# Patient Record
Sex: Female | Born: 1990 | Race: Black or African American | Hispanic: No | Marital: Single | Smoking: Current every day smoker
Health system: Southern US, Community
[De-identification: ages and names within clinical notes are randomized; demographics above are authoritative.]

## PROBLEM LIST (undated history)

## (undated) ENCOUNTER — Inpatient Hospital Stay (HOSPITAL_COMMUNITY): Payer: Self-pay

## (undated) DIAGNOSIS — G43909 Migraine, unspecified, not intractable, without status migrainosus: Secondary | ICD-10-CM

## (undated) DIAGNOSIS — J45909 Unspecified asthma, uncomplicated: Secondary | ICD-10-CM

## (undated) HISTORY — PX: NO PAST SURGERIES: SHX2092

---

## 2013-09-21 ENCOUNTER — Emergency Department (HOSPITAL_COMMUNITY)
Admission: EM | Admit: 2013-09-21 | Discharge: 2013-09-21 | Disposition: A | Discharge status: Home / Self Care | Payer: Medicaid - Out of State | Attending: Emergency Medicine | Admitting: Emergency Medicine

## 2013-09-21 ENCOUNTER — Encounter (HOSPITAL_COMMUNITY): Payer: Self-pay | Admitting: Emergency Medicine

## 2013-09-21 DIAGNOSIS — Z79899 Other long term (current) drug therapy: Secondary | ICD-10-CM | POA: Insufficient documentation

## 2013-09-21 DIAGNOSIS — F17200 Nicotine dependence, unspecified, uncomplicated: Secondary | ICD-10-CM | POA: Insufficient documentation

## 2013-09-21 DIAGNOSIS — N12 Tubulo-interstitial nephritis, not specified as acute or chronic: Secondary | ICD-10-CM | POA: Insufficient documentation

## 2013-09-21 DIAGNOSIS — J45909 Unspecified asthma, uncomplicated: Secondary | ICD-10-CM | POA: Insufficient documentation

## 2013-09-21 DIAGNOSIS — Z3202 Encounter for pregnancy test, result negative: Secondary | ICD-10-CM | POA: Insufficient documentation

## 2013-09-21 HISTORY — DX: Migraine, unspecified, not intractable, without status migrainosus: G43.909

## 2013-09-21 HISTORY — DX: Unspecified asthma, uncomplicated: J45.909

## 2013-09-21 LAB — CBC WITH DIFFERENTIAL/PLATELET
Basophils Absolute: 0 10*3/uL (ref 0.0–0.1)
Basophils Relative: 0 % (ref 0–1)
Eosinophils Absolute: 0.2 10*3/uL (ref 0.0–0.7)
HCT: 40.8 % (ref 36.0–46.0)
Hemoglobin: 13.7 g/dL (ref 12.0–15.0)
Lymphocytes Relative: 13 % (ref 12–46)
MCH: 29.7 pg (ref 26.0–34.0)
Monocytes Absolute: 0.7 10*3/uL (ref 0.1–1.0)
Neutro Abs: 7.8 10*3/uL — ABNORMAL HIGH (ref 1.7–7.7)
Neutrophils Relative %: 79 % — ABNORMAL HIGH (ref 43–77)
Platelets: 246 10*3/uL (ref 150–400)
RDW: 12.6 % (ref 11.5–15.5)
WBC: 9.9 10*3/uL (ref 4.0–10.5)

## 2013-09-21 LAB — URINE MICROSCOPIC-ADD ON

## 2013-09-21 LAB — COMPREHENSIVE METABOLIC PANEL
Alkaline Phosphatase: 83 U/L (ref 39–117)
BUN: 8 mg/dL (ref 6–23)
CO2: 28 mEq/L (ref 19–32)
Calcium: 9.4 mg/dL (ref 8.4–10.5)
GFR calc Af Amer: >90 mL/min (ref >90)
GFR calc non Af Amer: >90 mL/min (ref >90)
Glucose, Bld: 77 mg/dL (ref 70–99)
Sodium: 138 mEq/L (ref 135–145)
Total Protein: 8 g/dL (ref 6.0–8.3)

## 2013-09-21 LAB — URINALYSIS, ROUTINE W REFLEX MICROSCOPIC
Ketones, ur: 15 mg/dL — AB
Nitrite: NEGATIVE
Protein, ur: 100 mg/dL — AB
Specific Gravity, Urine: 1.026 (ref 1.005–1.030)
Urobilinogen, UA: 0.2 mg/dL (ref 0.0–1.0)
pH: 5.5 (ref 5.0–8.0)

## 2013-09-21 MED ORDER — CIPROFLOXACIN HCL 500 MG PO TABS: 500.0000 mg | ORAL_TABLET | Freq: Two times a day (BID) | ORAL | Status: DC

## 2013-09-21 NOTE — ED Provider Notes (Signed)
CSN: [id number]     Arrival date & time 09/21/13  1543 History   First MD Initiated Contact with Patient 09/21/13 1607     Chief Complaint  Patient presents with  . Flank Pain   (Consider location/radiation/quality/duration/timing/severity/associated sxs/prior Treatment) Patient is a 22 y.o. female presenting with flank pain.  Flank Pain This is a new problem. The current episode started yesterday. The problem occurs constantly. The problem has been unchanged. Pertinent negatives include no abdominal pain, chest pain, coughing, fever, headaches or nausea. Nothing aggravates the symptoms. She has tried nothing for the symptoms.   Chief complaint: Flank pain history route of patient  Patient is a 22 year old female no significant past medical history comes in today with chief complaint of flank pain. Patient states she had symptoms of cystitis approximately one week ago. Including dysuria and frequency. He self resolved. However yesterday patient began having bilateral flank pain. States this is a sharp stabbing pain moderate in severity. Located in bilateral flanks no radiation. It is been present since yesterday. Slightly worsening. No associated fevers chills et Karie Soda. OTC treatment attempted so far mild reduction pain. Past Medical History  Diagnosis Date  . Migraine   . Asthma    History reviewed. No pertinent past surgical history. No family history on file. History  Substance Use Topics  . Smoking status: Current Every Day Smoker -- 0.50 packs/day    Types: Cigarettes  . Smokeless tobacco: Not on file  . Alcohol Use: No   OB History   Grav Para Term Preterm Abortions TAB SAB Ect Mult Living                 Review of Systems  Constitutional: Negative for fever.  Respiratory: Negative for cough and shortness of breath.   Cardiovascular: Negative for chest pain.  Gastrointestinal: Negative for nausea and abdominal pain.  Genitourinary: Positive for flank pain. Negative for  dysuria.  Neurological: Negative for dizziness and headaches.  All other systems reviewed and are negative.    Allergies  Review of patient's allergies indicates no known allergies.  Home Medications   Current Outpatient Rx  Name  Route  Sig  Dispense  Refill  . albuterol (PROVENTIL HFA;VENTOLIN HFA) 108 (90 BASE) MCG/ACT inhaler   Inhalation   Inhale 2 puffs into the lungs every 6 (six) hours as needed for wheezing or shortness of breath.         Marland Kitchen aspirin 325 MG EC tablet   Oral   Take 650 mg by mouth 2 (two) times daily as needed for pain.         Marland Kitchen docusate sodium (COLACE) 100 MG capsule   Oral   Take 200 mg by mouth daily.         . hydrocortisone cream 1 %   Topical   Apply 1 application topically 2 (two) times daily as needed for itching (eczema and facial spots).         . Pediatric Multiple Vit-C-FA (MULTIVITAMIN ANIMAL SHAPES, WITH CA/FA,) WITH C & FA CHEW chewable tablet   Oral   Chew 1 tablet by mouth daily.         . ciprofloxacin (CIPRO) 500 MG tablet   Oral   Take 1 tablet (500 mg total) by mouth every 12 (twelve) hours. For 7 days   14 tablet   0    BP 99/63  Pulse 72  Temp(Src) 98.3 F (36.8 C) (Oral)  Resp 16  Ht 5\' 5"  (1.651  m)  Wt 160 lb (72.576 kg)  BMI 26.63 kg/m2  SpO2 99%  LMP 09/20/2013 Physical Exam  Nursing note and vitals reviewed. Constitutional: She is oriented to person, place, and time. She appears well-developed and well-nourished.  HENT:  Head: Normocephalic and atraumatic.  Eyes: EOM are normal. Pupils are equal, round, and reactive to light.  Neck: Normal range of motion.  Cardiovascular: Normal rate, regular rhythm and intact distal pulses.   No murmur heard. Pulmonary/Chest: Effort normal and breath sounds normal. No respiratory distress.  Abdominal: Soft. She exhibits no distension. There is no tenderness. There is no rebound and no guarding.  Musculoskeletal: Normal range of motion.  Mild bilateral flank  pain. Some CVA tenderness palpation  Neurological: She is alert and oriented to person, place, and time. No cranial nerve deficit. She exhibits normal muscle tone. Coordination normal.  Skin: Skin is warm and dry.  Psychiatric: She has a normal mood and affect. Her behavior is normal. Judgment and thought content normal.    ED Course  Procedures (including critical care time) Labs Review Labs Reviewed  URINALYSIS, ROUTINE W REFLEX MICROSCOPIC - Abnormal; Notable for the following:    Color, Urine RED (*)    APPearance TURBID (*)    Hgb urine dipstick LARGE (*)    Bilirubin Urine MODERATE (*)    Ketones, ur 15 (*)    Protein, ur 100 (*)    Leukocytes, UA LARGE (*)    All other components within normal limits  URINE MICROSCOPIC-ADD ON - Abnormal; Notable for the following:    Squamous Epithelial / LPF FEW (*)    All other components within normal limits  CBC WITH DIFFERENTIAL - Abnormal; Notable for the following:    Neutrophils Relative % 79 (*)    Neutro Abs 7.8 (*)    All other components within normal limits  URINE CULTURE  PREGNANCY, URINE  COMPREHENSIVE METABOLIC PANEL   Imaging Review No results found.  EKG Interpretation   None       MDM   1. Pyelonephritis     22 year old female signs and symptoms consistent with pyelonephritis. Patient with bilateral flank pain. UA showed large leukocytes, too numerous to count bacteria. UA also significant blood. Patient on period currently. Urine culture pending. Patient is nontoxic appearing, in no apparent distress. CBC within normal limits with no leukocytosis. Patient able to tolerate by mouth without issue. CMP unremarkable. No renal dysfunction noted. Feel patient appropriate for outpatient treatment given overall clinical appearance, tolerating by mouth et Karie Soda. Patient will be given Cipro for 7 days. Was given strict return precautions including worsening of pain inability to tolerate by mouth or other concerning signs  or symptoms. Patient denies any vaginal discharge or other pelvic complaints. Clelia Croft was deferred at this time. Remains stable until time discharged no further acute issues.  Patient discussed with attending Dr. Anitra Lauth.      Bridgett Larsson, MD 09/21/13 1950

## 2013-09-21 NOTE — ED Notes (Signed)
Pt is here for bilateral "side pain"  Pt initially thought she was constipated but symptoms unrelieved once constipation resolved.  Pt denies any GYn symptoms.  Pt states that she had some difficulty voiding when this began but pt denies any GU symptoms at this time.  Pt states that she had some nausea and vomiting yesterday and has loose stools (due to laxatives and prune juice for constipation)  Pt states that the pain is 6/10 and leaning forward increases pain

## 2013-09-21 NOTE — ED Provider Notes (Signed)
I saw and evaluated the patient, reviewed the resident's note and I agree with the findings and plan.  EKG Interpretation   None       Pt with evidence of pyelo without complicating features.  No hx of KS and no hx today to suggest stone.  Will treat with abx.  Gwyneth Sprout, MD 09/21/13 2252

## 2013-09-23 LAB — URINE CULTURE: Colony Count: >=100000

## 2013-09-24 ENCOUNTER — Telehealth (HOSPITAL_COMMUNITY): Payer: Self-pay | Admitting: Emergency Medicine

## 2013-09-24 NOTE — ED Notes (Signed)
Post ED Visit - Positive Culture Follow-up: Successful Patient Follow-Up  Culture assessed and recommendations reviewed by: []  Wes Dulaney, Pharm.D., BCPS []  Celedonio Miyamoto, Pharm.D., BCPS []  Georgina Pillion, Pharm.D., BCPS []  [place], [place].D., BCPS, AAHIVP [x]  Estella Husk, Pharm.D., BCPS, AAHIVP  Positive urine culture  []  Patient discharged without antimicrobial prescription and treatment is now indicated [x]  Organism is resistant to prescribed ED discharge antimicrobial []  Patient with positive blood cultures  Changes discussed with ED provider: Marlon Pel PA-C New antibiotic prescription: Septra DS 1 tab PO BID x 7 days    Kylie A Holland 09/24/2013, 12:50 PM

## 2013-09-24 NOTE — Progress Notes (Signed)
ED Antimicrobial Stewardship Positive Culture Follow Up   Sylvia Mccann is an 22 y.o. female who presented to [facility] on 09/21/2013 with a chief complaint of  Chief Complaint  Patient presents with  . Flank Pain    Recent Results (from the past 720 hour(s))  URINE CULTURE     Status: None   Collection Time    09/21/13  4:01 PM      Result Value Range Status   Specimen Description URINE, RANDOM   Final   Special Requests NONE   Final   Culture  Setup Time     Final   Value: 09/21/2013 17:02     Performed at [facility] Count     Final   Value: >=100,000 COLONIES/ML     Performed at [facility]   Culture     Final   Value: STAPHYLOCOCCUS SPECIES (COAGULASE NEGATIVE)     Note: RIFAMPIN AND GENTAMICIN SHOULD NOT BE USED AS SINGLE DRUGS FOR TREATMENT OF STAPH INFECTIONS.     Performed at [facility]   Report Status 09/23/2013 FINAL   Final   Organism ID, Bacteria STAPHYLOCOCCUS SPECIES (COAGULASE NEGATIVE)   Final    [x]  Treated with Cipro, organism resistant to prescribed antimicrobial  New antibiotic prescription: Septra DS 1 tab PO BID x 7 days.  Patient should stop Cipro.  ED Provider: Marlon Pel, PA-C   Sallee Provencal 09/24/2013, 11:16 AM Infectious Diseases Pharmacist Phone# [phone]

## 2013-09-25 ENCOUNTER — Telehealth (HOSPITAL_BASED_OUTPATIENT_CLINIC_OR_DEPARTMENT_OTHER): Payer: Self-pay

## 2013-09-25 NOTE — Telephone Encounter (Signed)
Pt returned call. Verified ID. Informed of lab results. Advised to stop taking cipro and called in Septra DS as prescribed by Tiffany Greene-(take one tab PO BID x 7 days, disp QS no refills) to [facility] on [address] [phone] per pts request. Pt verbalized understanding on how to take medication.

## 2014-05-12 ENCOUNTER — Emergency Department (HOSPITAL_COMMUNITY)
Admission: EM | Admit: 2014-05-12 | Discharge: 2014-05-12 | Disposition: A | Discharge status: Home / Self Care | Payer: No Typology Code available for payment source | Attending: Emergency Medicine | Admitting: Emergency Medicine

## 2014-05-12 ENCOUNTER — Encounter (HOSPITAL_COMMUNITY): Payer: Self-pay | Admitting: Emergency Medicine

## 2014-05-12 DIAGNOSIS — R102 Pelvic and perineal pain: Secondary | ICD-10-CM

## 2014-05-12 DIAGNOSIS — Z3202 Encounter for pregnancy test, result negative: Secondary | ICD-10-CM | POA: Insufficient documentation

## 2014-05-12 DIAGNOSIS — Z79899 Other long term (current) drug therapy: Secondary | ICD-10-CM | POA: Insufficient documentation

## 2014-05-12 DIAGNOSIS — J45909 Unspecified asthma, uncomplicated: Secondary | ICD-10-CM | POA: Insufficient documentation

## 2014-05-12 DIAGNOSIS — F17200 Nicotine dependence, unspecified, uncomplicated: Secondary | ICD-10-CM | POA: Insufficient documentation

## 2014-05-12 DIAGNOSIS — N949 Unspecified condition associated with female genital organs and menstrual cycle: Secondary | ICD-10-CM | POA: Insufficient documentation

## 2014-05-12 DIAGNOSIS — Z8679 Personal history of other diseases of the circulatory system: Secondary | ICD-10-CM | POA: Insufficient documentation

## 2014-05-12 LAB — URINALYSIS, ROUTINE W REFLEX MICROSCOPIC
Bilirubin Urine: NEGATIVE
Glucose, UA: NEGATIVE mg/dL
Hgb urine dipstick: NEGATIVE
Ketones, ur: 15 mg/dL — AB
Leukocytes, UA: NEGATIVE
Nitrite: NEGATIVE
Protein, ur: NEGATIVE mg/dL
Specific Gravity, Urine: 1.025 (ref 1.005–1.030)
Urobilinogen, UA: 1 mg/dL (ref 0.0–1.0)
pH: 6 (ref 5.0–8.0)

## 2014-05-12 LAB — POC URINE PREG, ED: Preg Test, Ur: NEGATIVE

## 2014-05-12 MED ORDER — IBUPROFEN 400 MG PO TABS: 600.0000 mg | ORAL_TABLET | Freq: Once | ORAL | Status: DC

## 2014-05-12 MED ORDER — OXYCODONE-ACETAMINOPHEN 5-325 MG PO TABS
2.0000 | ORAL_TABLET | ORAL | Status: DC | PRN
Start: 2014-05-12 — End: 2014-11-28

## 2014-05-12 MED ORDER — OXYCODONE-ACETAMINOPHEN 5-325 MG PO TABS: 2.0000 | ORAL_TABLET | Freq: Once | ORAL | Status: DC

## 2014-05-12 NOTE — ED Notes (Signed)
Pt presents with bilateral suprapubic pain x1 week, pt also reports frequent urination x2 days, pt states she was diagnosed with a kidney infection November 2014 and has several episodes of a "shocking" sensation to her bilateral flank region. Pt denies burning with urination, abnormal vaginal odor, discharge, or bleeding. Pt does report a hx of ovarian cyst, states the Left one "ruptured" on it's own and she knows she has a small one on the Right side. Pt states she has increase pressure to her abd if she sits up and leans forward.

## 2014-05-12 NOTE — ED Notes (Signed)
She states "im having pelvic pain. It feels like when i had a UTI."

## 2014-05-12 NOTE — ED Notes (Signed)
Pt getting up to leave after seeing Dr. Juleen ChinaKohut, she refused pelvic exam stating that she would be seeing her gynecologist soon. Pt refusing medications, stating she "just wants to leave." Pt given discharge paperwork on her way out, refusing discharge vitals, and e-signature.

## 2014-05-12 NOTE — Discharge Instructions (Signed)

## 2014-05-17 NOTE — ED Provider Notes (Signed)
CSN: [id number]     Arrival date & time 05/12/14  1850 History   First MD Initiated Contact with Patient 05/12/14 2204     Chief Complaint  Patient presents with  . Pelvic Pain     (Consider location/radiation/quality/duration/timing/severity/associated sxs/prior Treatment) HPI  23 year old female with lower abdominal pain. Gradual onset about a week ago. Pain is crampy. Worse when she leans forward. She occasionally has sharper "shocklike" pain which radiates into both of her flanks. Increased urinary frequency the past 2 days. No dysuria. No fevers or chills. No diarrhea. No nausea or vomiting. No unusual vaginal bleeding or discharge. Does not think she is pregnant.  Past Medical History  Diagnosis Date  . Migraine   . Asthma    History reviewed. No pertinent past surgical history. History reviewed. No pertinent family history. History  Substance Use Topics  . Smoking status: Current Every Day Smoker -- 0.50 packs/day    Types: Cigarettes  . Smokeless tobacco: Not on file  . Alcohol Use: No   OB History   Grav Para Term Preterm Abortions TAB SAB Ect Mult Living                 Review of Systems  All systems reviewed and negative, other than as noted in HPI.   Allergies  Review of patient's allergies indicates no known allergies.  Home Medications   Prior to Admission medications   Medication Sig Start Date End Date Taking? Authorizing Provider  albuterol (PROVENTIL HFA;VENTOLIN HFA) 108 (90 BASE) MCG/ACT inhaler Inhale 2 puffs into the lungs every 6 (six) hours as needed for wheezing or shortness of breath.   Yes Historical Provider, MD  divalproex (DEPAKOTE) 250 MG DR tablet Take 250 mg by mouth daily. Take 1 daily x 14 days, 1 every other day x 14 days, then one every third day x 14 days 05/01/14  Yes Historical Provider, MD  Pediatric Multiple Vit-C-FA (MULTIVITAMIN ANIMAL SHAPES, WITH CA/FA,) WITH C & FA CHEW chewable tablet Chew 1 tablet by mouth daily.   Yes  Historical Provider, MD  oxyCODONE-acetaminophen (PERCOCET/ROXICET) 5-325 MG per tablet Take 2 tablets by mouth every 4 (four) hours as needed for severe pain. 05/12/14   Raeford Razor, MD   BP 118/73  Pulse 96  Temp(Src) 98.8 F (37.1 C) (Oral)  Resp 20  Ht 5\' 4"  (1.626 m)  Wt 165 lb (74.844 kg)  BMI 28.31 kg/m2  SpO2 97%  LMP 04/28/2014 Physical Exam  Nursing note and vitals reviewed. Constitutional: She appears well-developed and well-nourished. No distress.  HENT:  Head: Normocephalic and atraumatic.  Eyes: Conjunctivae are normal. Right eye exhibits no discharge. Left eye exhibits no discharge.  Neck: Neck supple.  Cardiovascular: Normal rate, regular rhythm and normal heart sounds.  Exam reveals no gallop and no friction rub.   No murmur heard. Pulmonary/Chest: Effort normal and breath sounds normal. No respiratory distress.  Abdominal: Soft. She exhibits no distension. There is tenderness.  Mild suprapubic tenderness without rebound or guarding  Genitourinary:  No CVA tenderness  Musculoskeletal: She exhibits no edema and no tenderness.  Neurological: She is alert.  Skin: Skin is warm and dry.  Psychiatric: She has a normal mood and affect. Her behavior is normal. Thought content normal.    ED Course  Procedures (including critical care time) Labs Review Labs Reviewed  URINALYSIS, ROUTINE W REFLEX MICROSCOPIC - Abnormal; Notable for the following:    Ketones, ur 15 (*)    All other components  within normal limits  POC URINE PREG, ED    Imaging Review No results found.   EKG Interpretation None      MDM   Final diagnoses:  Pelvic pain in female    23 year old female with pelvic pain. She does have some mild suprapubic tenderness. Urinalysis is unremarkable though. Patient has declined pelvic examination. She understands that this limits her evaluation. She's not pregnant. She seen in medically stable. Unfortunately patient had a prolonged wait prior to my  evaluation. She is anxious to leave. She is declining pain medications. She reports she has an upcoming appointment with her gynecologist within the next week. I feel she is stable for followup then. Return precautions were discussed. Low suspicion for acute surgical process.   Raeford RazorStephen Yuri Flener, MD 05/17/14 1212

## 2014-09-20 ENCOUNTER — Emergency Department (HOSPITAL_COMMUNITY)
Admission: EM | Admit: 2014-09-20 | Discharge: 2014-09-20 | Disposition: A | Discharge status: Home / Self Care | Payer: No Typology Code available for payment source | Attending: Emergency Medicine | Admitting: Emergency Medicine

## 2014-09-20 ENCOUNTER — Encounter (HOSPITAL_COMMUNITY): Payer: Self-pay | Admitting: *Deleted

## 2014-09-20 DIAGNOSIS — IMO0002 Reserved for concepts with insufficient information to code with codable children: Secondary | ICD-10-CM

## 2014-09-20 DIAGNOSIS — Z79899 Other long term (current) drug therapy: Secondary | ICD-10-CM | POA: Diagnosis not present

## 2014-09-20 DIAGNOSIS — S61212A Laceration without foreign body of right middle finger without damage to nail, initial encounter: Secondary | ICD-10-CM | POA: Insufficient documentation

## 2014-09-20 DIAGNOSIS — Z720 Tobacco use: Secondary | ICD-10-CM | POA: Insufficient documentation

## 2014-09-20 DIAGNOSIS — Z23 Encounter for immunization: Secondary | ICD-10-CM | POA: Diagnosis not present

## 2014-09-20 DIAGNOSIS — Y9289 Other specified places as the place of occurrence of the external cause: Secondary | ICD-10-CM | POA: Insufficient documentation

## 2014-09-20 DIAGNOSIS — Y998 Other external cause status: Secondary | ICD-10-CM | POA: Diagnosis not present

## 2014-09-20 DIAGNOSIS — Y9389 Activity, other specified: Secondary | ICD-10-CM | POA: Diagnosis not present

## 2014-09-20 DIAGNOSIS — S61011A Laceration without foreign body of right thumb without damage to nail, initial encounter: Secondary | ICD-10-CM | POA: Diagnosis not present

## 2014-09-20 DIAGNOSIS — J45909 Unspecified asthma, uncomplicated: Secondary | ICD-10-CM | POA: Diagnosis not present

## 2014-09-20 DIAGNOSIS — Z8679 Personal history of other diseases of the circulatory system: Secondary | ICD-10-CM | POA: Insufficient documentation

## 2014-09-20 DIAGNOSIS — W274XXA Contact with kitchen utensil, initial encounter: Secondary | ICD-10-CM | POA: Diagnosis not present

## 2014-09-20 MED ORDER — LIDOCAINE HCL (PF) 1 % IJ SOLN
30.0000 mL | Freq: Once | INTRAMUSCULAR | Status: AC
  Administered 2014-09-20: 30 mL
  Filled 2014-09-20: qty 30

## 2014-09-20 MED ORDER — TETANUS-DIPHTH-ACELL PERTUSSIS 5-2.5-18.5 LF-MCG/0.5 IM SUSP
0.5000 mL | Freq: Once | INTRAMUSCULAR | Status: AC
  Administered 2014-09-20: 0.5 mL via INTRAMUSCULAR
  Filled 2014-09-20: qty 0.5

## 2014-09-20 NOTE — ED Provider Notes (Signed)
CSN: [id number]     Arrival date & time 09/20/14  1554 History  This chart was scribed for non-physician practitioner working with No att. providers found by Elveria Risingimelie Horne, ED Scribe. This patient was seen in room TR11C/TR11C and the patient's care was started at 4:04 PM.   Chief Complaint  Patient presents with  . Laceration   The history is provided by the patient. No language interpreter was used.   HPI Comments: Sylvia Mccann is a 23 y.o. female who presents to the Emergency Department with multiple lacerations to her right hand incurred while opening can of yams today. Lacerations to webbing separating thumb and second digit, and middle phalanx of third finger. Patient presents her hand securely bandaged; bleeding controlled. Patient denies loss of sensation or weakness. Patient is uncertain of date of her last Tetanus and is due for update.   Past Medical History  Diagnosis Date  . Migraine   . Asthma    History reviewed. No pertinent past surgical history. No family history on file. History  Substance Use Topics  . Smoking status: Current Every Day Smoker -- 0.50 packs/day    Types: Cigarettes  . Smokeless tobacco: Not on file  . Alcohol Use: No   OB History    No data available     Review of Systems  Constitutional: Negative for fever and chills.  Skin: Positive for wound.       Lacerations  Neurological: Negative for weakness and numbness.   Allergies  Review of patient's allergies indicates no known allergies.  Home Medications   Prior to Admission medications   Medication Sig Start Date End Date Taking? Authorizing Provider  albuterol (PROVENTIL HFA;VENTOLIN HFA) 108 (90 BASE) MCG/ACT inhaler Inhale 2 puffs into the lungs every 6 (six) hours as needed for wheezing or shortness of breath.    Historical Provider, MD  divalproex (DEPAKOTE) 250 MG DR tablet Take 250 mg by mouth daily. Take 1 daily x 14 days, 1 every other day x 14 days, then one every third day x 14  days 05/01/14   Historical Provider, MD  oxyCODONE-acetaminophen (PERCOCET/ROXICET) 5-325 MG per tablet Take 2 tablets by mouth every 4 (four) hours as needed for severe pain. 05/12/14   Raeford RazorStephen Kohut, MD  Pediatric Multiple Vit-C-FA (MULTIVITAMIN ANIMAL SHAPES, WITH CA/FA,) WITH C & FA CHEW chewable tablet Chew 1 tablet by mouth daily.    Historical Provider, MD   Triage Vitals: BP 104/60 mmHg  Pulse 84  Temp(Src) 98.4 F (36.9 C) (Oral)  Resp 16  Ht 5\' 4"  (1.626 m)  Wt 168 lb (76.204 kg)  BMI 28.82 kg/m2  SpO2 100% Physical Exam  Constitutional: She is oriented to person, place, and time. She appears well-developed and well-nourished. No distress.  HENT:  Head: Normocephalic and atraumatic.  Eyes: EOM are normal.  Neck: Neck supple. No tracheal deviation present.  Cardiovascular: Normal rate.   Pulmonary/Chest: Effort normal. No respiratory distress.  Musculoskeletal: Normal range of motion.  Neurological: She is alert and oriented to person, place, and time.  Skin: Skin is warm and dry.  Right hand: 1 cm laceration in the web space of right thumb and small laceration to dorsal aspect of middle finger.  Also 1 cm circular, superficial skin laceration with superficial skin avulsed.  Laceration does not extend past the dermis.  Superficial skin is white and has no sensation.   Distal sensation intact. 5/5 motor strength. Capillary refill <2 seconds.   Psychiatric: She has a  normal mood and affect. Her behavior is normal.  Nursing note and vitals reviewed.   ED Course  LACERATION REPAIR Date/Time: 09/20/2014 5:36 PM Performed by: Monte FantasiaMINTZ, JOSEPH W Authorized by: Monte FantasiaMINTZ, JOSEPH W Consent: Verbal consent obtained. Consent given by: patient Patient identity confirmed: verbally with patient Time out: Immediately prior to procedure a "time out" was called to verify the correct patient, procedure, equipment, support staff and site/side marked as required. Body area: upper  extremity Location details: right thumb Laceration length: 2 cm Foreign bodies: no foreign bodies Tendon involvement: none Nerve involvement: none Vascular damage: no Anesthesia: local infiltration Local anesthetic: lidocaine 2% without epinephrine Anesthetic total: 3 ml Patient sedated: no Preparation: Patient was prepped and draped in the usual sterile fashion. Irrigation solution: saline Amount of cleaning: standard Debridement: none Degree of undermining: none Skin closure: 5-0 Prolene Number of sutures: 5 Technique: simple Approximation: close Approximation difficulty: complex Dressing: 4x4 sterile gauze Patient tolerance: Patient tolerated the procedure well with no immediate complications   (including critical care time)   COORDINATION OF CARE: 4:13 PM- Plans to repair laceration at web spacing. Discussed treatment plan with patient at bedside and patient agreed to plan.   Labs Review Labs Reviewed - No data to display  Imaging Review No results found.   EKG Interpretation None      MDM   Final diagnoses:  Laceration   Patient here with 2 skin wounds. One of her lacerations is in her web space of her right thumb. Laceration does not extend past the dermis. No tendon or nerve involvement. Normal PMS. Laceration repaired with sutures as noted in procedure note above. Patient also has a small avulsion to skin of her dorsal right middle finger. Avulsion is circular and approximately 1 cm in diameter. There is a flap of skin hanging off which appears nonviable. Skin was removed, and patient given instruction to keep superficial infection clean. Due to the superficial nature of this wound, and avulsed skin, the wound is not amenable to suturing or repair. This wound was cleaned, and bandage properly, patient given instructions with return precautions.  Tdap booster given. Wound cleaning complete with pressure irrigation, bottom of wound visualized, no foreign bodies  appreciated. Laceration occurred < 8 hours prior to repair which was well tolerated. Pt has no co morbidities to effect normal wound healing. Discussed suture home care w pt and answered questions. Pt to f-u for wound check and suture removal in 7 days. Pt is hemodynamically stable w no complaints prior to dc.    I personally performed the services described in this documentation, which was scribed in my presence. The recorded information has been reviewed and is accurate.  BP 112/40 mmHg  Pulse 79  Temp(Src) 98.4 F (36.9 C) (Oral)  Resp 16  Ht 5\' 4"  (1.626 m)  Wt 168 lb (76.204 kg)  BMI 28.82 kg/m2  SpO2 100%  Signed,  Ladona MowJoe Estrella Alcaraz, PA-C 5:47 PM   Monte FantasiaJoseph W French Kendra, PA-C 09/20/14 1747  Derwood KaplanAnkit Nanavati, MD 09/22/14 [phone]

## 2014-09-20 NOTE — ED Notes (Signed)
Laceration to the rt hand on a can while cooking today.  Lacs to her rt thumb and index finger web space and rt distal lphalanx of the rt middle finger.  Bleeding controlled

## 2014-09-20 NOTE — Discharge Instructions (Signed)
Please go to your Primary Care Physician, an Urgent Care or return to the Emergency Department to have your staples or sutures removed 7 -10 days from today.  Sutured Wound Care Sutures are stitches that can be used to close wounds. Wound care helps prevent pain and infection.  HOME CARE INSTRUCTIONS   Rest and elevate the injured area until all the pain and swelling are gone.  Only take over-the-counter or prescription medicines for pain, discomfort, or fever as directed by your caregiver.  After 48 hours, gently wash the area with mild soap and water once a day, or as directed. Rinse off the soap. Pat the area dry with a clean towel. Do not rub the wound. This may cause bleeding.  Follow your caregiver's instructions for how often to change the bandage (dressing). Stop using a dressing after 2 days or after the wound stops draining.  If the dressing sticks, moisten it with soapy water and gently remove it.  Apply ointment on the wound as directed.  Avoid stretching a sutured wound.  Drink enough fluids to keep your urine clear or pale yellow.  Follow up with your caregiver for suture removal as directed.  Use sunscreen on your wound for the next 3 to 6 months so the scar will not darken. SEEK IMMEDIATE MEDICAL CARE IF:   Your wound becomes red, swollen, hot, or tender.  You have increasing pain in the wound.  You have a red streak that extends from the wound.  There is pus coming from the wound.  You have a fever.  You have shaking chills.  There is a bad smell coming from the wound.  You have persistent bleeding from the wound. MAKE SURE YOU:   Understand these instructions.  Will watch your condition.  Will get help right away if you are not doing well or get worse. Document Released: 11/19/2004 Document Revised: 01/04/2012 Document Reviewed: 02/15/2011 [facility] Patient Information 2015 [place]. This information is not intended to replace advice given to  you by your health care provider. Make sure you discuss any questions you have with your health care provider.  Laceration Care, Adult A laceration is a cut or lesion that goes through all layers of the skin and into the tissue just beneath the skin. TREATMENT  Some lacerations may not require closure. Some lacerations may not be able to be closed due to an increased risk of infection. It is important to see your caregiver as soon as possible after an injury to minimize the risk of infection and maximize the opportunity for successful closure. If closure is appropriate, pain medicines may be given, if needed. The wound will be cleaned to help prevent infection. Your caregiver will use stitches (sutures), staples, wound glue (adhesive), or skin adhesive strips to repair the laceration. These tools bring the skin edges together to allow for faster healing and a better cosmetic outcome. However, all wounds will heal with a scar. Once the wound has healed, scarring can be minimized by covering the wound with sunscreen during the day for 1 full year. HOME CARE INSTRUCTIONS  For sutures or staples:  Keep the wound clean and dry.  If you were given a bandage (dressing), you should change it at least once a day. Also, change the dressing if it becomes wet or dirty, or as directed by your caregiver.  Wash the wound with soap and water 2 times a day. Rinse the wound off with water to remove all soap. Pat the  wound dry with a clean towel.  After cleaning, apply a thin layer of the antibiotic ointment as recommended by your caregiver. This will help prevent infection and keep the dressing from sticking.  You may shower as usual after the first 24 hours. Do not soak the wound in water until the sutures are removed.  Only take over-the-counter or prescription medicines for pain, discomfort, or fever as directed by your caregiver.  Get your sutures or staples removed as directed by your caregiver. For skin  adhesive strips:  Keep the wound clean and dry.  Do not get the skin adhesive strips wet. You may bathe carefully, using caution to keep the wound dry.  If the wound gets wet, pat it dry with a clean towel.  Skin adhesive strips will fall off on their own. You may trim the strips as the wound heals. Do not remove skin adhesive strips that are still stuck to the wound. They will fall off in time. For wound adhesive:  You may briefly wet your wound in the shower or bath. Do not soak or scrub the wound. Do not swim. Avoid periods of heavy perspiration until the skin adhesive has fallen off on its own. After showering or bathing, gently pat the wound dry with a clean towel.  Do not apply liquid medicine, cream medicine, or ointment medicine to your wound while the skin adhesive is in place. This may loosen the film before your wound is healed.  If a dressing is placed over the wound, be careful not to apply tape directly over the skin adhesive. This may cause the adhesive to be pulled off before the wound is healed.  Avoid prolonged exposure to sunlight or tanning lamps while the skin adhesive is in place. Exposure to ultraviolet light in the first year will darken the scar.  The skin adhesive will usually remain in place for 5 to 10 days, then naturally fall off the skin. Do not pick at the adhesive film. You may need a tetanus shot if:  You cannot remember when you had your last tetanus shot.  You have never had a tetanus shot. If you get a tetanus shot, your arm may swell, get red, and feel warm to the touch. This is common and not a problem. If you need a tetanus shot and you choose not to have one, there is a rare chance of getting tetanus. Sickness from tetanus can be serious. SEEK MEDICAL CARE IF:   You have redness, swelling, or increasing pain in the wound.  You see a red line that goes away from the wound.  You have yellowish-white fluid (pus) coming from the wound.  You have  a fever.  You notice a bad smell coming from the wound or dressing.  Your wound breaks open before or after sutures have been removed.  You notice something coming out of the wound such as wood or glass.  Your wound is on your hand or foot and you cannot move a finger or toe. SEEK IMMEDIATE MEDICAL CARE IF:   Your pain is not controlled with prescribed medicine.  You have severe swelling around the wound causing pain and numbness or a change in color in your arm, hand, leg, or foot.  Your wound splits open and starts bleeding.  You have worsening numbness, weakness, or loss of function of any joint around or beyond the wound.  You develop painful lumps near the wound or on the skin anywhere on your body. MAKE  SURE YOU:   Understand these instructions.  Will watch your condition.  Will get help right away if you are not doing well or get worse. Document Released: 10/12/2005 Document Revised: 01/04/2012 Document Reviewed: 04/07/2011 [facility] Patient Information 2015 [place], [place]. This information is not intended to replace advice given to you by your health care provider. Make sure you discuss any questions you have with your health care provider.

## 2014-09-20 NOTE — ED Notes (Signed)
PA at bedside to suture.

## 2014-11-27 ENCOUNTER — Encounter (HOSPITAL_COMMUNITY): Payer: Self-pay | Admitting: Emergency Medicine

## 2014-11-27 ENCOUNTER — Emergency Department (HOSPITAL_COMMUNITY)
Admission: EM | Admit: 2014-11-27 | Discharge: 2014-11-28 | Disposition: A | Discharge status: Home / Self Care | Payer: No Typology Code available for payment source | Attending: Emergency Medicine | Admitting: Emergency Medicine

## 2014-11-27 DIAGNOSIS — R000 Tachycardia, unspecified: Secondary | ICD-10-CM | POA: Insufficient documentation

## 2014-11-27 DIAGNOSIS — Z79899 Other long term (current) drug therapy: Secondary | ICD-10-CM | POA: Diagnosis not present

## 2014-11-27 DIAGNOSIS — T391X1A Poisoning by 4-Aminophenol derivatives, accidental (unintentional), initial encounter: Secondary | ICD-10-CM | POA: Diagnosis not present

## 2014-11-27 DIAGNOSIS — K088 Other specified disorders of teeth and supporting structures: Secondary | ICD-10-CM | POA: Diagnosis not present

## 2014-11-27 DIAGNOSIS — R112 Nausea with vomiting, unspecified: Secondary | ICD-10-CM | POA: Diagnosis not present

## 2014-11-27 DIAGNOSIS — R197 Diarrhea, unspecified: Secondary | ICD-10-CM | POA: Diagnosis not present

## 2014-11-27 DIAGNOSIS — K0889 Other specified disorders of teeth and supporting structures: Secondary | ICD-10-CM

## 2014-11-27 DIAGNOSIS — G40909 Epilepsy, unspecified, not intractable, without status epilepticus: Secondary | ICD-10-CM | POA: Diagnosis not present

## 2014-11-27 DIAGNOSIS — J45909 Unspecified asthma, uncomplicated: Secondary | ICD-10-CM | POA: Diagnosis not present

## 2014-11-27 LAB — CBC WITH DIFFERENTIAL/PLATELET
Basophils Absolute: 0 K/uL (ref 0.0–0.1)
Basophils Relative: 0 % (ref 0–1)
Eosinophils Absolute: 0.1 K/uL (ref 0.0–0.7)
Eosinophils Relative: 1 % (ref 0–5)
HCT: 39.2 % (ref 36.0–46.0)
Hemoglobin: 13.2 g/dL (ref 12.0–15.0)
Lymphocytes Relative: 8 % — ABNORMAL LOW (ref 12–46)
Lymphs Abs: 0.9 K/uL (ref 0.7–4.0)
MCH: 29.9 pg (ref 26.0–34.0)
MCHC: 33.7 g/dL (ref 30.0–36.0)
MCV: 88.9 fL (ref 78.0–100.0)
Monocytes Absolute: 0.5 K/uL (ref 0.1–1.0)
Monocytes Relative: 4 % (ref 3–12)
Neutro Abs: 9.9 K/uL — ABNORMAL HIGH (ref 1.7–7.7)
Neutrophils Relative %: 87 % — ABNORMAL HIGH (ref 43–77)
Platelets: 291 K/uL (ref 150–400)
RBC: 4.41 MIL/uL (ref 3.87–5.11)
RDW: 12 % (ref 11.5–15.5)
WBC: 11.4 K/uL — ABNORMAL HIGH (ref 4.0–10.5)

## 2014-11-27 LAB — POC URINE PREG, ED: Preg Test, Ur: NEGATIVE

## 2014-11-27 MED ORDER — ONDANSETRON HCL 4 MG/2ML IJ SOLN
4.0000 mg | Freq: Once | INTRAMUSCULAR | Status: AC
  Administered 2014-11-27: 4 mg via INTRAVENOUS
  Filled 2014-11-27: qty 2

## 2014-11-27 MED ORDER — ACETYLCYSTEINE LOAD VIA INFUSION: 150.0000 mg/kg | Freq: Once | INTRAVENOUS | Status: DC

## 2014-11-27 MED ORDER — SODIUM CHLORIDE 0.9 % IV BOLUS (SEPSIS)
1000.0000 mL | Freq: Once | INTRAVENOUS | Status: AC
  Administered 2014-11-27: 1000 mL via INTRAVENOUS

## 2014-11-27 MED ORDER — FAMOTIDINE IN NACL 20-0.9 MG/50ML-% IV SOLN
20.0000 mg | Freq: Once | INTRAVENOUS | Status: AC
  Administered 2014-11-27: 20 mg via INTRAVENOUS
  Filled 2014-11-27: qty 50

## 2014-11-27 NOTE — ED Notes (Signed)
Pt reported that this morning around 1am she developed a severe toothache and between 1am and 8am today she took 22 500mg  tylenol tablets.

## 2014-11-27 NOTE — ED Notes (Signed)
Spoke with poison control regarding tylenol ingestion, reports that patient needs a loading dose of mucamist- PO at 140mg /kg or IV 150mg /kg infusing over 1 hour, will need cardiac monitor, any elevation in LFT or any detectable tylenol level will indicate need for 24 hour of mucamist. MD Otter aware.

## 2014-11-27 NOTE — ED Notes (Signed)
Pt placed in cardiac monitor

## 2014-11-27 NOTE — ED Notes (Signed)
Pt. reports multiple emesis and diarrhea onset this evening , denies abdominal pain / no fever or chills.

## 2014-11-27 NOTE — ED Provider Notes (Addendum)
CSN: [id number]     Arrival date & time 11/27/14  2309 History   First MD Initiated Contact with Patient 11/27/14 2332     Chief Complaint  Patient presents with  . Emesis  . Diarrhea     (Consider location/radiation/quality/duration/timing/severity/associated sxs/prior Treatment) HPI  Sylvia Mccann is a 24 y.o. female with past medical history significant for migraine and asthma, active daily smoker patient took 22 500 mg Tylenol for tooth ache in between 1 AM and 8 AM today. She then developed multiple episodes of nausea and vomiting in addition to diarrhea. She denies abdominal pain. States the emesis is brown. She takes acetaminophen chronically for her headaches. She states she did not have any yesterday, she is not an alcohol drinker. She denies suicide attempt, states that she only took the medication to relieve her tooth pain.  Past Medical History  Diagnosis Date  . Migraine   . Asthma    History reviewed. No pertinent past surgical history. No family history on file. History  Substance Use Topics  . Smoking status: Current Every Day Smoker -- 0.50 packs/day    Types: Cigarettes  . Smokeless tobacco: Not on file  . Alcohol Use: No   OB History    No data available     Review of Systems  10 systems reviewed and found to be negative, except as noted in the HPI.   Allergies  Review of patient's allergies indicates no known allergies.  Home Medications   Prior to Admission medications   Medication Sig Start Date End Date Taking? Authorizing Provider  albuterol (PROVENTIL HFA;VENTOLIN HFA) 108 (90 BASE) MCG/ACT inhaler Inhale 2 puffs into the lungs every 6 (six) hours as needed for wheezing or shortness of breath.    Historical Provider, MD  divalproex (DEPAKOTE) 250 MG DR tablet Take 250 mg by mouth daily. Take 1 daily x 14 days, 1 every other day x 14 days, then one every third day x 14 days 05/01/14   Historical Provider, MD  oxyCODONE-acetaminophen  (PERCOCET/ROXICET) 5-325 MG per tablet Take 2 tablets by mouth every 4 (four) hours as needed for severe pain. 05/12/14   Raeford Razor, MD  Pediatric Multiple Vit-C-FA (MULTIVITAMIN ANIMAL SHAPES, WITH CA/FA,) WITH C & FA CHEW chewable tablet Chew 1 tablet by mouth daily.    Historical Provider, MD   BP 114/76 mmHg  Pulse 112  Temp(Src) 98.2 F (36.8 C) (Oral)  Resp 16  SpO2 99%  LMP 11/13/2014 Physical Exam  Constitutional: She is oriented to person, place, and time. She appears well-developed and well-nourished. No distress.  HENT:  Head: Normocephalic and atraumatic.  Mouth/Throat: Oropharynx is clear and moist.  Eyes: Conjunctivae and EOM are normal.  Cardiovascular: Regular rhythm.   Mild tachycardia  Pulmonary/Chest: Effort normal and breath sounds normal. No stridor. No respiratory distress. She has no wheezes. She has no rales. She exhibits no tenderness.  Abdominal: Soft. Bowel sounds are normal. She exhibits no distension and no mass. There is no tenderness. There is no rebound and no guarding.  Musculoskeletal: Normal range of motion.  Neurological: She is alert and oriented to person, place, and time.  Psychiatric: She has a normal mood and affect.  Nursing note and vitals reviewed.   ED Course  Procedures (including critical care time)  CRITICAL CARE Performed by: Sylvia Mccann   Total critical care time: 45  Critical care time was exclusive of separately billable procedures and treating other patients.  Critical care was necessary to  treat or prevent imminent or life-threatening deterioration.  Critical care was time spent personally by me on the following activities: development of treatment plan with patient and/or surrogate as well as nursing, discussions with consultants, evaluation of patient's response to treatment, examination of patient, obtaining history from patient or surrogate, ordering and performing treatments and interventions, ordering and  review of laboratory studies, ordering and review of radiographic studies, pulse oximetry and re-evaluation of patient's condition.  Labs Review Labs Reviewed  CBC WITH DIFFERENTIAL/PLATELET - Abnormal; Notable for the following:    WBC 11.4 (*)    Neutrophils Relative % 87 (*)    Neutro Abs 9.9 (*)    Lymphocytes Relative 8 (*)    All other components within normal limits  COMPREHENSIVE METABOLIC PANEL - Abnormal; Notable for the following:    Potassium 3.4 (*)    Glucose, Bld 104 (*)    All other components within normal limits  ACETAMINOPHEN LEVEL - Abnormal; Notable for the following:    Acetaminophen (Tylenol), Serum <10.0 (*)    All other components within normal limits  PROTIME-INR - Abnormal; Notable for the following:    Prothrombin Time 15.8 (*)    All other components within normal limits  APTT - Abnormal; Notable for the following:    aPTT 39 (*)    All other components within normal limits  SALICYLATE LEVEL  ACETAMINOPHEN LEVEL  HEPATIC FUNCTION PANEL  POC URINE PREG, ED    Imaging Review No results found.   EKG Interpretation None      MDM   Final diagnoses:  Acetaminophen overdose, accidental or unintentional, initial encounter    Filed Vitals:   11/27/14 2315 11/27/14 2337 11/27/14 2358  BP: 114/76    Pulse: 112    Temp: 98.2 F (36.8 C)    TempSrc: Oral    Resp: 16    Weight:   170 lb (77.111 kg)  SpO2: 100% 99%     Medications  sodium chloride 0.9 % bolus 1,000 mL (1,000 mLs Intravenous New Bag/Given 11/27/14 2350)  famotidine (PEPCID) IVPB 20 mg (20 mg Intravenous New Bag/Given 11/27/14 2350)  acetylcysteine (ACETADOTE) 40 mg/mL load via infusion 11,565 mg (not administered)    Followed by  acetylcysteine (ACETADOTE) 40,000 mg in dextrose 5 % 1,000 mL (40 mg/mL) infusion (not administered)  ondansetron (ZOFRAN) injection 4 mg (4 mg Intravenous Given 11/27/14 2350)    Sylvia Mccann is a pleasant 24 y.o. female presenting with multiple  episodes of nausea vomiting and diarrhea after patient accidentally overdosed on acetaminophen. She took 11,000 mg over the course of 7 hours between 1 AM and 8 AM this morning. She was trying to treat or toothache. Does not have underlying liver disease, is not a drinker. She states that she does take Tylenol regular for chronic headache should never have any yesterday.  [facility], they have talked to their toxicologist and recommended if acetaminophen level is normal no Mucomyst will be administered.  Acetaminophen level is less than 10. Discussed with patient to is very clear that she took 500 mg of acetaminophen exactly 22 pills.  Discussed with attending, plan is to DC Mucomyst and recheck acetaminophen and LFTs at 4 AM.       Sylvia EmeryNicole Emberlee Sortino, Sylvia Mccann 11/28/14 0155  Olivia Mackielga M Otter, MD 11/28/14 0715  Sylvia EmeryNicole Arwa Mccann, Sylvia Mccann 12/02/14 1655  Olivia Mackielga M Otter, MD 12/03/14 [phone]

## 2014-11-28 LAB — HEPATIC FUNCTION PANEL
ALK PHOS: 58 U/L (ref 39–117)
ALT: 15 U/L (ref 0–35)
AST: 20 U/L (ref 0–37)
Albumin: 4 g/dL (ref 3.5–5.2)
BILIRUBIN DIRECT: 0.2 mg/dL (ref 0.0–0.5)
BILIRUBIN TOTAL: 0.7 mg/dL (ref 0.3–1.2)
Indirect Bilirubin: 0.5 mg/dL (ref 0.3–0.9)
Total Protein: 7.6 g/dL (ref 6.0–8.3)

## 2014-11-28 LAB — COMPREHENSIVE METABOLIC PANEL
ALK PHOS: 62 U/L (ref 39–117)
ALT: 18 U/L (ref 0–35)
ANION GAP: 6 (ref 5–15)
AST: 22 U/L (ref 0–37)
Albumin: 4.6 g/dL (ref 3.5–5.2)
BUN: 9 mg/dL (ref 6–23)
CO2: 30 mmol/L (ref 19–32)
Calcium: 9.1 mg/dL (ref 8.4–10.5)
Chloride: 101 mmol/L (ref 96–112)
Creatinine, Ser: 0.87 mg/dL (ref 0.50–1.10)
GFR calc Af Amer: >90 mL/min (ref >90)
Glucose, Bld: 104 mg/dL — ABNORMAL HIGH (ref 70–99)
POTASSIUM: 3.4 mmol/L — AB (ref 3.5–5.1)
Sodium: 137 mmol/L (ref 135–145)
Total Bilirubin: 0.8 mg/dL (ref 0.3–1.2)
Total Protein: 7.9 g/dL (ref 6.0–8.3)

## 2014-11-28 LAB — ACETAMINOPHEN LEVEL
Acetaminophen (Tylenol), Serum: <10 ug/mL — ABNORMAL LOW (ref 10–30)
Acetaminophen (Tylenol), Serum: <10 ug/mL — ABNORMAL LOW (ref 10–30)

## 2014-11-28 LAB — PROTIME-INR
INR: 1.25 (ref 0.00–1.49)
PROTHROMBIN TIME: 15.8 s — AB (ref 11.6–15.2)

## 2014-11-28 LAB — SALICYLATE LEVEL: Salicylate Lvl: <4 mg/dL (ref 2.8–20.0)

## 2014-11-28 LAB — APTT: APTT: 39 s — AB (ref 24–37)

## 2014-11-28 MED ORDER — ONDANSETRON 8 MG PO TBDP: 8.0000 mg | ORAL_TABLET | Freq: Three times a day (TID) | ORAL | Status: DC | PRN

## 2014-11-28 MED ORDER — OXYCODONE HCL 5 MG PO TABS
10.0000 mg | ORAL_TABLET | Freq: Once | ORAL | Status: AC
  Administered 2014-11-28: 10 mg via ORAL
  Filled 2014-11-28: qty 2

## 2014-11-28 MED ORDER — PENICILLIN V POTASSIUM 250 MG PO TABS
500.0000 mg | ORAL_TABLET | Freq: Once | ORAL | Status: AC
  Administered 2014-11-28: 500 mg via ORAL
  Filled 2014-11-28: qty 2

## 2014-11-28 MED ORDER — PENICILLIN V POTASSIUM 500 MG PO TABS: 500.0000 mg | ORAL_TABLET | Freq: Four times a day (QID) | ORAL | Status: DC

## 2014-11-28 MED ORDER — ACETYLCYSTEINE LOAD VIA INFUSION
150.0000 mg/kg | Freq: Once | INTRAVENOUS | Status: DC
  Filled 2014-11-28: qty 290

## 2014-11-28 MED ORDER — MORPHINE SULFATE 4 MG/ML IJ SOLN
4.0000 mg | Freq: Once | INTRAMUSCULAR | Status: AC
  Administered 2014-11-28: 4 mg via INTRAVENOUS
  Filled 2014-11-28: qty 1

## 2014-11-28 MED ORDER — DEXTROSE 5 % IV SOLN
15.0000 mg/kg/h | INTRAVENOUS | Status: DC
  Filled 2014-11-28: qty 200

## 2014-11-28 MED ORDER — OXYCODONE HCL 10 MG PO TABS: 10.0000 mg | ORAL_TABLET | Freq: Four times a day (QID) | ORAL | Status: DC | PRN

## 2014-11-28 NOTE — ED Notes (Signed)
Pt a/o x 4 on d/c with steady gait. 

## 2014-11-28 NOTE — Discharge Instructions (Signed)
Take medications as prescribed.  Stick to a bland diet until feeling better.  Please avoid taking more than the recommended dosing of Tylenol written on the bottle as too much Tylenol can be toxic to your liver and can cause death   Dental Care and Dentist Visits Dental care supports good overall health. Regular dental visits can also help you avoid dental pain, bleeding, infection, and other more serious health problems in the future. It is important to keep the mouth healthy because diseases in the teeth, gums, and other oral tissues can spread to other areas of the body. Some problems, such as diabetes, heart disease, and pre-term labor have been associated with poor oral health.  See your dentist every 6 months. If you experience emergency problems such as a toothache or broken tooth, go to the dentist right away. If you see your dentist regularly, you may catch problems early. It is easier to be treated for problems in the early stages.  WHAT TO EXPECT AT A DENTIST VISIT  Your dentist will look for many common oral health problems and recommend proper treatment. At your regular dental visit, you can expect:  Gentle cleaning of the teeth and gums. This includes scraping and polishing. This helps to remove the sticky substance around the teeth and gums (plaque). Plaque forms in the mouth shortly after eating. Over time, plaque hardens on the teeth as tartar. If tartar is not removed regularly, it can cause problems. Cleaning also helps remove stains.  Periodic X-rays. These pictures of the teeth and supporting bone will help your dentist assess the health of your teeth.  Periodic fluoride treatments. Fluoride is a natural mineral shown to help strengthen teeth. Fluoride treatmentinvolves applying a fluoride gel or varnish to the teeth. It is most commonly done in children.  Examination of the mouth, tongue, jaws, teeth, and gums to look for any oral health problems, such as:  Cavities (dental  caries). This is decay on the tooth caused by plaque, sugar, and acid in the mouth. It is best to catch a cavity when it is small.  Inflammation of the gums caused by plaque buildup (gingivitis).  Problems with the mouth or malformed or misaligned teeth.  Oral cancer or other diseases of the soft tissues or jaws. KEEP YOUR TEETH AND GUMS HEALTHY For healthy teeth and gums, follow these general guidelines as well as your dentist's specific advice:  Have your teeth professionally cleaned at the dentist every 6 months.  Brush twice daily with a fluoride toothpaste.  Floss your teeth daily.  Ask your dentist if you need fluoride supplements, treatments, or fluoride toothpaste.  Eat a healthy diet. Reduce foods and drinks with added sugar.  Avoid smoking. TREATMENT FOR ORAL HEALTH PROBLEMS If you have oral health problems, treatment varies depending on the conditions present in your teeth and gums.  Your caregiver will most likely recommend good oral hygiene at each visit.  For cavities, gingivitis, or other oral health disease, your caregiver will perform a procedure to treat the problem. This is typically done at a separate appointment. Sometimes your caregiver will refer you to another dental specialist for specific tooth problems or for surgery. SEEK IMMEDIATE DENTAL CARE IF:  You have pain, bleeding, or soreness in the gum, tooth, jaw, or mouth area.  A permanent tooth becomes loose or separated from the gum socket.  You experience a blow or injury to the mouth or jaw area. Document Released: 06/24/2011 Document Revised: 01/04/2012 Document Reviewed: 06/24/2011  [employer] Patient Information 2015 [place], [place]. This information is not intended to replace advice given to you by your health care provider. Make sure you discuss any questions you have with your health care provider.  Dental Pain Toothache is pain in or around a tooth. It may get worse with chewing or with cold or  heat.  HOME CARE  Your dentist may use a numbing medicine during treatment. If so, you may need to avoid eating until the medicine wears off. Ask your dentist about this.  Only take medicine as told by your dentist or doctor.  Avoid chewing food near the painful tooth until after all treatment is done. Ask your dentist about this. GET HELP RIGHT AWAY IF:   The problem gets worse or new problems appear.  You have a fever.  There is redness and puffiness (swelling) of the face, jaw, or neck.  You cannot open your mouth.  There is pain in the jaw.  There is very bad pain that is not helped by medicine. MAKE SURE YOU:   Understand these instructions.  Will watch your condition.  Will get help right away if you are not doing well or get worse. Document Released: 03/30/2008 Document Revised: 01/04/2012 Document Reviewed: 03/30/2008 [facility] Patient Information 2015 [place], [place]. This information is not intended to replace advice given to you by your health care provider. Make sure you discuss any questions you have with your health care provider.  Nausea and Vomiting Nausea is a sick feeling that often comes before throwing up (vomiting). Vomiting is a reflex where stomach contents come out of your mouth. Vomiting can cause severe loss of body fluids (dehydration). Children and elderly adults can become dehydrated quickly, especially if they also have diarrhea. Nausea and vomiting are symptoms of a condition or disease. It is important to find the cause of your symptoms. CAUSES   Direct irritation of the stomach lining. This irritation can result from increased acid production (gastroesophageal reflux disease), infection, food poisoning, taking certain medicines (such as nonsteroidal anti-inflammatory drugs), alcohol use, or tobacco use.  Signals from the brain.These signals could be caused by a headache, heat exposure, an inner ear disturbance, increased pressure in the brain from  injury, infection, a tumor, or a concussion, pain, emotional stimulus, or metabolic problems.  An obstruction in the gastrointestinal tract (bowel obstruction).  Illnesses such as diabetes, hepatitis, gallbladder problems, appendicitis, kidney problems, cancer, sepsis, atypical symptoms of a heart attack, or eating disorders.  Medical treatments such as chemotherapy and radiation.  Receiving medicine that makes you sleep (general anesthetic) during surgery. DIAGNOSIS Your caregiver may ask for tests to be done if the problems do not improve after a few days. Tests may also be done if symptoms are severe or if the reason for the nausea and vomiting is not clear. Tests may include:  Urine tests.  Blood tests.  Stool tests.  Cultures (to look for evidence of infection).  X-rays or other imaging studies. Test results can help your caregiver make decisions about treatment or the need for additional tests. TREATMENT You need to stay well hydrated. Drink frequently but in small amounts.You may wish to drink water, sports drinks, clear broth, or eat frozen ice pops or gelatin dessert to help stay hydrated.When you eat, eating slowly may help prevent nausea.There are also some antinausea medicines that may help prevent nausea. HOME CARE INSTRUCTIONS   Take all medicine as directed by your caregiver.  If you do not have an appetite,  do not force yourself to eat. However, you must continue to drink fluids.  If you have an appetite, eat a normal diet unless your caregiver tells you differently.  Eat a variety of complex carbohydrates (rice, wheat, potatoes, bread), lean meats, yogurt, fruits, and vegetables.  Avoid high-fat foods because they are more difficult to digest.  Drink enough water and fluids to keep your urine clear or pale yellow.  If you are dehydrated, ask your caregiver for specific rehydration instructions. Signs of dehydration may include:  Severe thirst.  Dry lips  and mouth.  Dizziness.  Dark urine.  Decreasing urine frequency and amount.  Confusion.  Rapid breathing or pulse. SEEK IMMEDIATE MEDICAL CARE IF:   You have blood or brown flecks (like coffee grounds) in your vomit.  You have black or bloody stools.  You have a severe headache or stiff neck.  You are confused.  You have severe abdominal pain.  You have chest pain or trouble breathing.  You do not urinate at least once every 8 hours.  You develop cold or clammy skin.  You continue to vomit for longer than 24 to 48 hours.  You have a fever. MAKE SURE YOU:   Understand these instructions.  Will watch your condition.  Will get help right away if you are not doing well or get worse. Document Released: 10/12/2005 Document Revised: 01/04/2012 Document Reviewed: 03/11/2011 [facility] Patient Information 2015 [place], [place]. This information is not intended to replace advice given to you by your health care provider. Make sure you discuss any questions you have with your health care provider.  Accidental Overdose A drug overdose occurs when a chemical substance (drug or medication) is used in amounts large enough to overcome a person. This may result in severe illness or death. This is a type of poisoning. Accidental overdoses of medications or other substances come from a variety of reasons. When this happens accidentally, it is often because the person taking the substance does not know enough about what they have taken. Drugs which commonly cause overdose deaths are alcohol, psychotropic medications (medications which affect the mind), pain medications, illegal drugs (street drugs) such as cocaine and heroin, and multiple drugs taken at the same time. It may result from careless behavior (such as over-indulging at a party). Other causes of overdose may include multiple drug use, a lapse in memory, or drug use after a period of no drug use.  Sometimes overdosing occurs because a  person cannot remember if they have taken their medication.  A common unintentional overdose in young children involves multi-vitamins containing iron. Iron is a part of the hemoglobin molecule in blood. It is used to transport oxygen to living cells. When taken in small amounts, iron allows the body to restock hemoglobin. In large amounts, it causes problems in the body. If this overdose is not treated, it can lead to death. Never take medicines that show signs of tampering or do not seem quite right. Never take medicines in the dark or in poor lighting. Read the label and check each dose of medicine before you take it. When adults are poisoned, it happens most often through carelessness or lack of information. Taking medicines in the dark or taking medicine prescribed for someone else to treat the same type of problem is a dangerous practice. SYMPTOMS  Symptoms of overdose depend on the medication and amount taken. They can vary from over-activity with stimulant over-dosage, to sleepiness from depressants such as alcohol, narcotics and tranquilizers.  Confusion, dizziness, nausea and vomiting may be present. If problems are severe enough coma and death may result. DIAGNOSIS  Diagnosis and management are generally straightforward if the drug is known. Otherwise it is more difficult. At times, certain symptoms and signs exhibited by the patient, or blood tests, can reveal the drug in question.  TREATMENT  In an emergency department, most patients can be treated with supportive measures. Antidotes may be available if there has been an overdose of opioids or benzodiazepines. A rapid improvement will often occur if this is the cause of overdose. At home or away from medical care:  There may be no immediate problems or warning signs in children.  Not everything works well in all cases of poisoning.  Take immediate action. Poisons may act quickly.  If you think someone has swallowed medicine or a  household product, and the person is unconscious, having seizures (convulsions), or is not breathing, immediately call for an ambulance. IF a person is conscious and appears to be doing OK but has swallowed a poison:  Do not wait to see what effect the poison will have. Immediately call a poison control center (listed in the white pages of your telephone book under "Poison Control" or inside the front cover with other emergency numbers). Some poison control centers have TTY capability for the deaf. Check with your local center if you or someone in your family requires this service.  Keep the container so you can read the label on the product for ingredients.  Describe what, when, and how much was taken and the age and condition of the person poisoned. Inform them if the person is vomiting, choking, drowsy, shows a change in color or temperature of skin, is conscious or unconscious, or is convulsing.  Do not cause vomiting unless instructed by medical personnel. Do not induce vomiting or force liquids into a person who is convulsing, unconscious, or very drowsy. Stay calm and in control.   Activated charcoal also is sometimes used in certain types of poisoning and you may wish to add a supply to your emergency medicines. It is available without a prescription. Call a poison control center before using this medication. PREVENTION  Thousands of children die every year from unintentional poisoning. This may be from household chemicals, poisoning from carbon monoxide in a car, taking their parent's medications, or simply taking a few iron pills or vitamins with iron. Poisoning comes from unexpected sources.  Store medicines out of the sight and reach of children, preferably in a locked cabinet. Do not keep medications in a food cabinet. Always store your medicines in a secure place. Get rid of expired medications.  If you have children living with you or have them as occasional guests, you should have  child-resistant caps on your medicine containers. Keep everything out of reach. Child proof your home.  If you are called to the telephone or to answer the door while you are taking a medicine, take the container with you or put the medicine out of the reach of small children.  Do not take your medication in front of children. Do not tell your child how good a medication is and how good it is for them. They may get the idea it is more of a treat.  If you are an adult and have accidentally taken an overdose, you need to consider how this happened and what can be done to prevent it from happening again. If this was from a street drug or alcohol,  determine if there is a problem that needs addressing. If you are not sure a problems exists, it is easy to talk to a professional and ask them if they think you have a problem. It is better to handle this problem in this way before it happens again and has a much worse consequence. Document Released: 12/26/2004 Document Revised: 01/04/2012 Document Reviewed: 06/03/2009 [facility] Patient Information 2015 [place], [place]. This information is not intended to replace advice given to you by your health care provider. Make sure you discuss any questions you have with your health care provider.

## 2016-05-17 ENCOUNTER — Encounter (HOSPITAL_COMMUNITY): Payer: Self-pay

## 2016-05-17 ENCOUNTER — Emergency Department (HOSPITAL_COMMUNITY)
Admission: EM | Admit: 2016-05-17 | Discharge: 2016-05-17 | Disposition: A | Discharge status: Home / Self Care | Payer: No Typology Code available for payment source | Attending: Emergency Medicine | Admitting: Emergency Medicine

## 2016-05-17 DIAGNOSIS — Z79899 Other long term (current) drug therapy: Secondary | ICD-10-CM | POA: Insufficient documentation

## 2016-05-17 DIAGNOSIS — F17210 Nicotine dependence, cigarettes, uncomplicated: Secondary | ICD-10-CM | POA: Insufficient documentation

## 2016-05-17 DIAGNOSIS — G43909 Migraine, unspecified, not intractable, without status migrainosus: Secondary | ICD-10-CM | POA: Insufficient documentation

## 2016-05-17 DIAGNOSIS — J45909 Unspecified asthma, uncomplicated: Secondary | ICD-10-CM | POA: Insufficient documentation

## 2016-05-17 MED ORDER — SODIUM CHLORIDE 0.9 % IV BOLUS (SEPSIS)
1000.0000 mL | Freq: Once | INTRAVENOUS | Status: AC
  Administered 2016-05-17: 1000 mL via INTRAVENOUS

## 2016-05-17 MED ORDER — KETOROLAC TROMETHAMINE 30 MG/ML IJ SOLN
30.0000 mg | Freq: Once | INTRAMUSCULAR | Status: AC
  Administered 2016-05-17: 30 mg via INTRAVENOUS
  Filled 2016-05-17: qty 1

## 2016-05-17 MED ORDER — PROCHLORPERAZINE EDISYLATE 5 MG/ML IJ SOLN
10.0000 mg | Freq: Once | INTRAMUSCULAR | Status: AC
  Administered 2016-05-17: 10 mg via INTRAVENOUS
  Filled 2016-05-17: qty 2

## 2016-05-17 MED ORDER — DIPHENHYDRAMINE HCL 50 MG/ML IJ SOLN
25.0000 mg | Freq: Once | INTRAMUSCULAR | Status: AC
  Administered 2016-05-17: 25 mg via INTRAVENOUS
  Filled 2016-05-17: qty 1

## 2016-05-17 NOTE — ED Provider Notes (Signed)
[facility]-EMERGENCY DEPT Provider Note   CSN: [id number] Arrival date & time: 05/17/16  1643  First Provider Contact:  First MD Initiated Contact with Patient 05/17/16 2023        History   Chief Complaint Chief Complaint  Patient presents with  . Migraine    HPI Sylvia Mccann is a 25 y.o. female.  Patient is a 25 year old female with a history of asthma and migraine headaches, followed by a neurologist in [facility], who presents to the emergency department for a right-sided, frontal, constant migraine which began 2 days ago. Patient states that she has been taking Tylenol as well as her prescribed nortriptyline without any improvement in her migraine symptoms. She states that her headache does feel similar to prior migraines. She denies any aggravating factors of her symptoms. No alleviating factors. Patient has not been associated with fever, syncope, vision loss, blurry vision, hearing changes, nausea, vomiting, photophobia, or phonophobia. Patient denies any recent changes to her headache medications. No recent head injury or trauma.   The history is provided by the patient. No language interpreter was used.  Migraine  Associated symptoms include headaches.    Past Medical History:  Diagnosis Date  . Asthma   . Migraine     There are no active problems to display for this patient.   History reviewed. No pertinent surgical history.  OB History    No data available       Home Medications    Prior to Admission medications   Medication Sig Start Date End Date Taking? Authorizing Provider  albuterol (PROVENTIL HFA;VENTOLIN HFA) 108 (90 BASE) MCG/ACT inhaler Inhale 2 puffs into the lungs every 6 (six) hours as needed for wheezing or shortness of breath.    Historical Provider, MD  divalproex (DEPAKOTE) 250 MG DR tablet Take 250 mg by mouth daily. Take 1 daily x 14 days, 1 every other day x 14 days, then one every third day x 14 days 05/01/14   Historical Provider, MD    nortriptyline (PAMELOR) 25 MG capsule Take 25 mg by mouth at bedtime.    Historical Provider, MD  ondansetron (ZOFRAN ODT) 8 MG disintegrating tablet Take 1 tablet (8 mg total) by mouth every 8 (eight) hours as needed for nausea or vomiting. 11/28/14   Marisa Severin, MD  oxyCODONE 10 MG TABS Take 1 tablet (10 mg total) by mouth every 6 (six) hours as needed for severe pain. 11/28/14   Marisa Severin, MD  Pediatric Multiple Vit-C-FA (MULTIVITAMIN ANIMAL SHAPES, WITH CA/FA,) WITH C & FA CHEW chewable tablet Chew 1 tablet by mouth daily.    Historical Provider, MD  penicillin v potassium (VEETID) 500 MG tablet Take 1 tablet (500 mg total) by mouth 4 (four) times daily. 11/28/14   Marisa Severin, MD    Family History No family history on file.  Social History Social History  Substance Use Topics  . Smoking status: Current Every Day Smoker    Packs/day: 0.50    Types: Cigarettes  . Smokeless tobacco: Never Used  . Alcohol use No     Allergies   Review of patient's allergies indicates no known allergies.   Review of Systems Review of Systems  Neurological: Positive for headaches.  Ten systems reviewed and are negative for acute change, except as noted in the HPI.    Physical Exam Updated Vital Signs BP 116/77 (BP Location: Right Arm)   Pulse 76   Temp 97.6 F (36.4 C) (Oral)   Resp 20  LMP 05/12/2016   SpO2 100%   Physical Exam  Constitutional: She is oriented to person, place, and time. She appears well-developed and well-nourished. No distress.  Nontoxic appearing and in no distress.  HENT:  Head: Normocephalic and atraumatic.  Eyes: Conjunctivae and EOM are normal. No scleral icterus.  Neck: Normal range of motion.  No nuchal rigidity or meningismus  Pulmonary/Chest: Effort normal. No respiratory distress.  Respirations even and unlabored  Abdominal: She exhibits no distension.  Musculoskeletal: Normal range of motion.  Neurological: She is alert and oriented to person, place, and  time. No cranial nerve deficit. She exhibits normal muscle tone. Coordination normal.  GCS 15. Speech is goal oriented. No cranial nerve deficits appreciated; symmetric eyebrow raise, no facial drooping, tongue midline. Patient has equal grip strength bilaterally with 5/5 strength against resistance in all major muscle groups bilaterally. Sensation to light touch intact. Patient moves extremities without ataxia.  Skin: Skin is warm and dry. No rash noted. She is not diaphoretic. No erythema. No pallor.  Psychiatric: She has a normal mood and affect. Her behavior is normal.  Nursing note and vitals reviewed.    ED Treatments / Results  Labs (all labs ordered are listed, but only abnormal results are displayed) Labs Reviewed - No data to display  EKG  EKG Interpretation None       Radiology No results found.  Procedures Procedures (including critical care time)  Medications Ordered in ED Medications  sodium chloride 0.9 % bolus 1,000 mL (not administered)  prochlorperazine (COMPAZINE) injection 10 mg (not administered)  diphenhydrAMINE (BENADRYL) injection 25 mg (not administered)  ketorolac (TORADOL) 30 MG/ML injection 30 mg (not administered)     Initial Impression / Assessment and Plan / ED Course  I have reviewed the triage vital signs and the nursing notes.  Pertinent labs & imaging results that were available during my care of the patient were reviewed by me and considered in my medical decision making (see chart for details).  Clinical Course    25 year old female with a history of migraine headaches presents to the emergency department for evaluation of a headache consistent with prior migraines. She is afebrile. No nuchal rigidity or meningismus. Neurologic exam nonfocal. Patient denies any recent head injury or trauma. Her symptoms have completely resolved with Compazine, Benadryl, and Toradol. Patient is requesting discharge. She does have outpatient neurology  follow-up as well as a primary care doctor with whom she can be reassessed. No indication for further emergent workup at this time. Low suspicion for emergent intracranial etiology. Patient discharged in satisfactory condition with no unaddressed concerns.   Final Clinical Impressions(s) / ED Diagnoses   Final diagnoses:  None    New Prescriptions New Prescriptions   No medications on file     Antony Madura, PA-C 05/17/16 2344    Alvira Monday, MD 05/18/16 1217

## 2016-05-17 NOTE — ED Triage Notes (Signed)
Patient here with migraine headache x 3 days. Reports ongoing history of same, denies trauma. No nausea, no vomiting, no photophobia.

## 2016-07-15 ENCOUNTER — Emergency Department (HOSPITAL_COMMUNITY)
Admission: EM | Admit: 2016-07-15 | Discharge: 2016-07-16 | Disposition: A | Discharge status: Home / Self Care | Payer: Medicaid - Out of State | Attending: Dermatology | Admitting: Dermatology

## 2016-07-15 ENCOUNTER — Encounter (HOSPITAL_COMMUNITY): Payer: Self-pay | Admitting: Emergency Medicine

## 2016-07-15 DIAGNOSIS — Z5321 Procedure and treatment not carried out due to patient leaving prior to being seen by health care provider: Secondary | ICD-10-CM | POA: Diagnosis not present

## 2016-07-15 DIAGNOSIS — F17210 Nicotine dependence, cigarettes, uncomplicated: Secondary | ICD-10-CM | POA: Diagnosis not present

## 2016-07-15 DIAGNOSIS — J392 Other diseases of pharynx: Secondary | ICD-10-CM | POA: Insufficient documentation

## 2016-07-15 DIAGNOSIS — J45909 Unspecified asthma, uncomplicated: Secondary | ICD-10-CM | POA: Diagnosis not present

## 2016-07-15 DIAGNOSIS — G43909 Migraine, unspecified, not intractable, without status migrainosus: Secondary | ICD-10-CM | POA: Insufficient documentation

## 2016-07-15 DIAGNOSIS — Z79899 Other long term (current) drug therapy: Secondary | ICD-10-CM | POA: Insufficient documentation

## 2016-07-15 DIAGNOSIS — Z792 Long term (current) use of antibiotics: Secondary | ICD-10-CM | POA: Diagnosis not present

## 2016-07-15 NOTE — ED Triage Notes (Signed)
During triage, patient is now complaining of a 10/10 migraine. Patient is alert, oriented x4, and ambulatory.

## 2016-07-15 NOTE — ED Notes (Signed)
Pt left @ 2313. Pt gave stickers to registration

## 2016-07-15 NOTE — ED Triage Notes (Signed)
Per EMS, patient states she choked on a migraine pill. Patient was able to cough pill up. Patient is complaining of throat irritation.

## 2017-03-26 ENCOUNTER — Emergency Department (HOSPITAL_COMMUNITY)
Admission: EM | Admit: 2017-03-26 | Discharge: 2017-03-26 | Disposition: A | Discharge status: Home / Self Care | Payer: Medicaid - Out of State | Attending: Emergency Medicine | Admitting: Emergency Medicine

## 2017-03-26 ENCOUNTER — Encounter (HOSPITAL_COMMUNITY): Payer: Self-pay | Admitting: *Deleted

## 2017-03-26 DIAGNOSIS — J45909 Unspecified asthma, uncomplicated: Secondary | ICD-10-CM | POA: Insufficient documentation

## 2017-03-26 DIAGNOSIS — L249 Irritant contact dermatitis, unspecified cause: Secondary | ICD-10-CM | POA: Insufficient documentation

## 2017-03-26 DIAGNOSIS — F17210 Nicotine dependence, cigarettes, uncomplicated: Secondary | ICD-10-CM | POA: Insufficient documentation

## 2017-03-26 DIAGNOSIS — M79675 Pain in left toe(s): Secondary | ICD-10-CM

## 2017-03-26 DIAGNOSIS — M79672 Pain in left foot: Secondary | ICD-10-CM | POA: Insufficient documentation

## 2017-03-26 DIAGNOSIS — Z79899 Other long term (current) drug therapy: Secondary | ICD-10-CM | POA: Insufficient documentation

## 2017-03-26 NOTE — ED Notes (Signed)
States  3 rd toe on  Left foot has developed a blister for the last couple of weeks and has not gone aWAY, states it hurts, she has tried to use OTC creams , suc=h as athlete foot cream and hydrocortisone cream but it has not helped

## 2017-03-26 NOTE — ED Provider Notes (Signed)
[facility] Provider Note   CSN: [id number] Arrival date & time: 03/26/17  1025  By signing my name below, I, Linna Darner, attest that this documentation has been prepared under the direction and in the presence of Lyndel Safe, [place]. Electronically Signed: Linna Darner, Scribe. 03/26/2017. 11:50 AM.  History   Chief Complaint Chief Complaint  Patient presents with  . Toe Pain   The history is provided by the patient. No language interpreter was used.    HPI Comments: Sylvia Mccann is a 26 y.o. female who presents to the Emergency Department complaining of persistent, gradually worsening, itching and burning sensations to the plantar surface of her left foot for two weeks. She states she has started to develop blisters to the plantar aspect of her left foot over the last few days. Patient endorses pain exacerbation with weight bearing and is ambulatory with difficulty secondary to pain. She has tried OTC athlete's foot creams and powders without improvement of her symptoms. No other medications or treatments tried. 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 She has no h/o the same. No h/o DM. She denies similar symptoms to her right foot. Patient further denies fevers, chills, joint swelling, or any other associated symptoms. No PCP or health insurance.  Past Medical History:  Diagnosis Date  . Asthma   . Migraine     There are no active problems to display for this patient.   History reviewed. No pertinent surgical history.  OB History    No data available       Home Medications    Prior to Admission medications   Medication Sig Start Date End Date Taking? Authorizing Provider  albuterol (PROVENTIL HFA;VENTOLIN HFA) 108 (90 BASE) MCG/ACT inhaler Inhale 2 puffs into the lungs every 6 (six) hours as needed for wheezing or shortness of breath.    [provider]    divalproex (DEPAKOTE) 250 MG DR tablet Take 250 mg by mouth daily. Take 1 daily x 14 days, 1 every other day x 14 days, then one every third day x 14 days 05/01/14   [provider]  nortriptyline (PAMELOR) 25 MG capsule Take 25 mg by mouth at bedtime.    [provider]  ondansetron (ZOFRAN ODT) 8 MG disintegrating tablet Take 1 tablet (8 mg total) by mouth every 8 (eight) hours as needed for nausea or vomiting. 11/28/14   Marisa Severin, MD  oxyCODONE 10 MG TABS Take 1 tablet (10 mg total) by mouth every 6 (six) hours as needed for severe pain. 11/28/14   Marisa Severin, MD  Pediatric Multiple Vit-C-FA (MULTIVITAMIN ANIMAL SHAPES, WITH CA/FA,) WITH C & FA CHEW chewable tablet Chew 1 tablet by mouth daily.    [provider]  penicillin v potassium (VEETID) 500 MG tablet Take 1 tablet (500 mg total) by mouth 4 (four) times daily. 11/28/14   Marisa Severin, MD    Family History History reviewed. No pertinent family history.  Social History Social History  Substance Use Topics  . Smoking status: Current Every Day Smoker    Packs/day: 0.50    Types: Cigarettes  . Smokeless tobacco: Never Used  . Alcohol use No     Allergies   Patient has no known allergies.   Review of Systems Review of Systems  Constitutional: Negative for chills and fever.  Musculoskeletal: Positive for arthralgias and gait problem. Negative for joint swelling.  Skin:  Positive for wound (blisters).   Physical Exam Updated Vital Signs BP 110/64 (BP Location: Right Arm)   Pulse 71   Temp 98.3 F (36.8 C) (Oral)   Resp 20   SpO2 100%   Physical Exam  Constitutional: She appears well-developed and well-nourished. No distress.  Cardiovascular: Normal rate.   Pulmonary/Chest: Effort normal. No respiratory distress.  Musculoskeletal: Normal range of motion.  Skin: Skin is warm and dry.  Left foot has mild erythema without edema to the plantar aspect of second, third, and fourth toes. There is no  crusting, skin peeling, drainage/exudate.No bruising or blisters noted.  Nursing note and vitals reviewed.  ED Treatments / Results  Labs (all labs ordered are listed, but only abnormal results are displayed) Labs Reviewed - No data to display  EKG  EKG Interpretation None       Radiology No results found.  Procedures Procedures (including critical care time)  DIAGNOSTIC STUDIES: Oxygen Saturation is 100% on RA, normal by my interpretation.    COORDINATION OF CARE: 11:50 AM Discussed treatment plan with pt at bedside and pt agreed to plan.  Medications Ordered in ED Medications - No data to display   Initial Impression / Assessment and Plan / ED Course  I have reviewed the triage vital signs and the nursing notes.  Pertinent labs & imaging results that were available during my care of the patient were reviewed by me and considered in my medical decision making (see chart for details).    Rash consistent with Irritant contact dermatitis secondary to sweat and mild skin maceration. No blisters, no pustules, no warmth, no draining sinus tracts, no superficial abscesses, no bullous impetigo, no vesicles, no desquamation, no target lesions with dusky purpura or a central bulla. Mildly tender to touch. No concern for superimposed infection. Patient was instructed to wear well ventilated shoes and change her socks multiple times during the day. Patient was instructed to keep her feet dry.  At this time patient does not appear to have any evidence of an acute emergency medical condition. Patient was given the option to ask questions, all of which were answered to the best of my abilities.  Patient is hemodynamically stable for discharge and is agreeable to plan/discharge.  Final Clinical Impressions(s) / ED Diagnoses   Final diagnoses:  Irritant contact dermatitis, unspecified trigger  Toe pain, left    New Prescriptions Discharge Medication List as of 03/26/2017 11:51 AM      I personally performed the services described in this documentation, which was scribed in my presence. The recorded information has been reviewed and is accurate.    Cristina GongHammond, Kypton Eltringham W, PA-C 03/28/17 1934    Nira Connardama, Pedro Eduardo, MD 03/30/17 [phone]

## 2017-03-26 NOTE — ED Triage Notes (Signed)
Pt in c/o blisters in between her toes on her left foot, this has been going on for the last two weeks, no redness or swelling noted

## 2017-09-07 ENCOUNTER — Encounter (HOSPITAL_COMMUNITY): Payer: Self-pay | Admitting: Emergency Medicine

## 2017-09-07 ENCOUNTER — Emergency Department (HOSPITAL_COMMUNITY)
Admission: EM | Admit: 2017-09-07 | Discharge: 2017-09-07 | Disposition: A | Discharge status: Home / Self Care | Payer: Medicaid - Out of State | Attending: Emergency Medicine | Admitting: Emergency Medicine

## 2017-09-07 ENCOUNTER — Other Ambulatory Visit: Payer: Self-pay

## 2017-09-07 DIAGNOSIS — N390 Urinary tract infection, site not specified: Secondary | ICD-10-CM

## 2017-09-07 DIAGNOSIS — Z79899 Other long term (current) drug therapy: Secondary | ICD-10-CM | POA: Insufficient documentation

## 2017-09-07 DIAGNOSIS — R319 Hematuria, unspecified: Secondary | ICD-10-CM

## 2017-09-07 DIAGNOSIS — F17210 Nicotine dependence, cigarettes, uncomplicated: Secondary | ICD-10-CM | POA: Insufficient documentation

## 2017-09-07 DIAGNOSIS — J45909 Unspecified asthma, uncomplicated: Secondary | ICD-10-CM | POA: Insufficient documentation

## 2017-09-07 LAB — BASIC METABOLIC PANEL WITH GFR
Anion gap: 10 (ref 5–15)
BUN: 17 mg/dL (ref 6–20)
CO2: 26 mmol/L (ref 22–32)
Calcium: 9.3 mg/dL (ref 8.9–10.3)
Chloride: 100 mmol/L — ABNORMAL LOW (ref 101–111)
Creatinine, Ser: 0.98 mg/dL (ref 0.44–1.00)
GFR calc Af Amer: >60 mL/min
GFR calc non Af Amer: >60 mL/min
Glucose, Bld: 73 mg/dL (ref 65–99)
Potassium: 3.6 mmol/L (ref 3.5–5.1)
Sodium: 136 mmol/L (ref 135–145)

## 2017-09-07 LAB — I-STAT BETA HCG BLOOD, ED (MC, WL, AP ONLY): I-stat hCG, quantitative: <5 m[IU]/mL

## 2017-09-07 LAB — URINALYSIS, ROUTINE W REFLEX MICROSCOPIC
Bilirubin Urine: NEGATIVE
Glucose, UA: NEGATIVE mg/dL
Ketones, ur: NEGATIVE mg/dL
Nitrite: NEGATIVE
Protein, ur: 30 mg/dL — AB
Specific Gravity, Urine: 1.019 (ref 1.005–1.030)
pH: 6 (ref 5.0–8.0)

## 2017-09-07 LAB — CBC
HCT: 39.6 % (ref 36.0–46.0)
Hemoglobin: 12.5 g/dL (ref 12.0–15.0)
MCH: 27.7 pg (ref 26.0–34.0)
MCHC: 31.6 g/dL (ref 30.0–36.0)
MCV: 87.8 fL (ref 78.0–100.0)
Platelets: 377 K/uL (ref 150–400)
RBC: 4.51 MIL/uL (ref 3.87–5.11)
RDW: 13.9 % (ref 11.5–15.5)
WBC: 10 K/uL (ref 4.0–10.5)

## 2017-09-07 MED ORDER — SULFAMETHOXAZOLE-TRIMETHOPRIM 800-160 MG PO TABS: 1.0000 | ORAL_TABLET | Freq: Once | ORAL | Status: DC

## 2017-09-07 MED ORDER — SULFAMETHOXAZOLE-TRIMETHOPRIM 800-160 MG PO TABS: 1.0000 | ORAL_TABLET | Freq: Two times a day (BID) | ORAL | 0 refills | Status: DC

## 2017-09-07 NOTE — ED Provider Notes (Signed)
[facility] Provider Note   CSN: [id number] Arrival date & time: 09/07/17  [id number]     History   Chief Complaint Chief Complaint  Patient presents with  . Abdominal Pain  . Flank Pain    HPI Sylvia Mccann is a 26 y.o. female.  Patient with a history of asthma and Migraine presents with concern urinary symptoms and abdominal pain. The patient states that she has been experiencing 1 week of increased urinary frequency, dysuria, and some blood in her urine for the past several days. She states that she came to the ED today because she began experiencing bilateral, sharp, flank and back pain this morning the pain was constant with 4-5 waves of increased intensity to 10/10 pain that lasted minutes. She took an excedrin migrain for a migraine today. Her pain is no longer present at the time of interview.  LMP was last week and she denies discharge or foul odor.        Past Medical History:  Diagnosis Date  . Asthma   . Migraine     There are no active problems to display for this patient.   History reviewed. No pertinent surgical history.  OB History    No data available       Home Medications    Prior to Admission medications   Medication Sig Start Date End Date Taking? Authorizing Provider  acetaminophen (TYLENOL) 500 MG tablet Take 500 mg every 6 (six) hours as needed by mouth for mild pain.   Yes [provider]  albuterol (PROVENTIL HFA;VENTOLIN HFA) 108 (90 BASE) MCG/ACT inhaler Inhale 2 puffs into the lungs every 6 (six) hours as needed for wheezing or shortness of breath.   Yes [provider]  aspirin-acetaminophen-caffeine (EXCEDRIN MIGRAINE) [phone] MG tablet Take 1 tablet every 6 (six) hours as needed by mouth for headache.   Yes [provider]  naproxen sodium (ALEVE) 220 MG tablet Take 220 mg 2 (two) times daily as needed by mouth (pain).   Yes [provider]  nortriptyline  (PAMELOR) 25 MG capsule Take 25 mg by mouth at bedtime.   Yes [provider]  ondansetron (ZOFRAN ODT) 8 MG disintegrating tablet Take 1 tablet (8 mg total) by mouth every 8 (eight) hours as needed for nausea or vomiting. Patient not taking: Reported on 09/07/2017 11/28/14   Marisa Severintter, Olga, MD  oxyCODONE 10 MG TABS Take 1 tablet (10 mg total) by mouth every 6 (six) hours as needed for severe pain. Patient not taking: Reported on 09/07/2017 11/28/14   Marisa Severintter, Olga, MD  penicillin v potassium (VEETID) 500 MG tablet Take 1 tablet (500 mg total) by mouth 4 (four) times daily. Patient not taking: Reported on 09/07/2017 11/28/14   Marisa Severintter, Olga, MD  sulfamethoxazole-trimethoprim (BACTRIM DS,SEPTRA DS) 800-160 MG tablet Take 1 tablet 2 (two) times daily by mouth. 09/07/17   Beola CordMelvin, Alexander, MD    Family History No family history on file.  Social History Social History   Tobacco Use  . Smoking status: Current Every Day Smoker    Packs/day: 0.50    Types: Cigarettes  . Smokeless tobacco: Never Used  Substance Use Topics  . Alcohol use: No  . Drug use: No     Allergies   Diclofenac potassium   Review of Systems Review of Systems  Constitutional: Negative for chills and fever.  HENT: Negative for ear pain and sore throat.   Eyes: Negative for pain and visual disturbance.  Respiratory: Negative for cough and shortness of breath.   Cardiovascular: Negative for chest pain and palpitations.  Gastrointestinal: Negative for abdominal pain and vomiting.  Genitourinary: Positive for dysuria, frequency and hematuria.  Musculoskeletal: Negative for arthralgias and back pain.  Skin: Negative for color change and rash.  Neurological: Negative for seizures and syncope.  All other systems reviewed and are negative.    Physical Exam Updated Vital Signs BP 91/67   Pulse 87   Temp 98.7 F (37.1 C) (Oral)   Resp 18   Ht 5\' 5"  (1.651 m)   Wt 81.6 kg (180 lb)   LMP 08/27/2017   SpO2 100%    BMI 29.95 kg/m   Physical Exam  Constitutional: She is oriented to person, place, and time. She appears well-developed and well-nourished. No distress.  HENT:  Head: Normocephalic and atraumatic.  Eyes: Conjunctivae are normal.  Neck: Neck supple.  Cardiovascular: Normal rate and regular rhythm.  No murmur heard. Pulmonary/Chest: Effort normal and breath sounds normal. No respiratory distress.  Abdominal: Soft. Bowel sounds are normal. She exhibits no distension.  Mild tenderness to palpation of RUQ  Musculoskeletal: She exhibits no edema or deformity.  No CVA trenderness  Neurological: She is alert and oriented to person, place, and time.  Skin: Skin is warm and dry.  Psychiatric: She has a normal mood and affect.  Nursing note and vitals reviewed.    ED Treatments / Results  Labs (all labs ordered are listed, but only abnormal results are displayed) Labs Reviewed  URINALYSIS, ROUTINE W REFLEX MICROSCOPIC - Abnormal; Notable for the following components:      Result Value   APPearance HAZY (*)    Hgb urine dipstick MODERATE (*)    Protein, ur 30 (*)    Leukocytes, UA LARGE (*)    Bacteria, UA FEW (*)    Squamous Epithelial / LPF 0-5 (*)    Non Squamous Epithelial 0-5 (*)    All other components within normal limits  BASIC METABOLIC PANEL - Abnormal; Notable for the following components:   Chloride 100 (*)    All other components within normal limits  CBC  I-STAT BETA HCG BLOOD, ED ([facility], [facility], [facility] ONLY)    EKG  EKG Interpretation None       Radiology No results found.  Procedures Procedures (including critical care time)  Medications Ordered in ED Medications  sulfamethoxazole-trimethoprim (BACTRIM DS,SEPTRA DS) 800-160 MG per tablet 1 tablet (not administered)     Initial Impression / Assessment and Plan / ED Course  I have reviewed the triage vital signs and the nursing notes.  Pertinent labs & imaging results that were available during my care of the  patient were reviewed by me and considered in my medical decision making (see chart for details).    Patient with 1 week increased urinary frequency and dysuria, presented following episodes of abdominal pain, which has since resolved. Beta-HCG negative, CBC and BMP WNL, U/A showing Large leukocytes, Few bacteria, Mod Hgb, and negative nitrites. Patients waves of abdominal pain.  Patient's history, physical, and lab results suspicious for UTI. Will provide patient with prescription for Bactrim and instructions to follow up with PCP.  Final Clinical Impressions(s) / ED Diagnoses   Final diagnoses:  Urinary tract infection with hematuria, site unspecified    ED Discharge Orders        Ordered    sulfamethoxazole-trimethoprim (BACTRIM DS,SEPTRA DS) 800-160 MG tablet  2 times daily     09/07/17 1233  Beola Cord, MD 09/07/17 1251    Jacalyn Lefevre, MD 09/07/17 1431

## 2017-09-07 NOTE — Discharge Instructions (Addendum)
Thank you for allowing us to care for you  Your symptoms are believed to have been cause by a urinary tract infection. - You have been provided with an prescription for an antibiotic, please take all doses as prescribed  Please follow up with your primary care provider   Return to the ED if you experience severe symptoms

## 2017-09-07 NOTE — ED Triage Notes (Signed)
Pt to ER for one week progressively worsening abd pain radiating to right flank with urinary frequency. States hx of UTI. A/o x4.

## 2017-09-07 NOTE — ED Notes (Signed)
Reports unable to urinate at this time, given cup to place sample in when able to

## 2017-09-09 ENCOUNTER — Encounter (HOSPITAL_COMMUNITY): Payer: Self-pay | Admitting: Emergency Medicine

## 2017-09-09 ENCOUNTER — Other Ambulatory Visit: Payer: Self-pay

## 2017-09-09 DIAGNOSIS — R110 Nausea: Secondary | ICD-10-CM | POA: Insufficient documentation

## 2017-09-09 DIAGNOSIS — F17210 Nicotine dependence, cigarettes, uncomplicated: Secondary | ICD-10-CM | POA: Insufficient documentation

## 2017-09-09 DIAGNOSIS — Z79899 Other long term (current) drug therapy: Secondary | ICD-10-CM | POA: Insufficient documentation

## 2017-09-09 DIAGNOSIS — N12 Tubulo-interstitial nephritis, not specified as acute or chronic: Secondary | ICD-10-CM | POA: Diagnosis not present

## 2017-09-09 DIAGNOSIS — R350 Frequency of micturition: Secondary | ICD-10-CM | POA: Diagnosis not present

## 2017-09-09 DIAGNOSIS — J45909 Unspecified asthma, uncomplicated: Secondary | ICD-10-CM | POA: Diagnosis not present

## 2017-09-09 DIAGNOSIS — R1032 Left lower quadrant pain: Secondary | ICD-10-CM | POA: Diagnosis present

## 2017-09-09 NOTE — ED Triage Notes (Signed)
Patient reports bilateral abdominal pain and left lower back pain onset last week no emesis or diarrhea , currently taking Septra DS for UTI prescribed here 2 days ago , denies fever or chills .

## 2017-09-10 ENCOUNTER — Emergency Department (HOSPITAL_COMMUNITY)
Admission: EM | Admit: 2017-09-10 | Discharge: 2017-09-10 | Disposition: A | Discharge status: Home / Self Care | Payer: Medicaid - Out of State | Attending: Emergency Medicine | Admitting: Emergency Medicine

## 2017-09-10 ENCOUNTER — Emergency Department (HOSPITAL_COMMUNITY): Payer: Medicaid - Out of State

## 2017-09-10 DIAGNOSIS — N12 Tubulo-interstitial nephritis, not specified as acute or chronic: Secondary | ICD-10-CM

## 2017-09-10 LAB — COMPREHENSIVE METABOLIC PANEL
ALBUMIN: 4 g/dL (ref 3.5–5.0)
ALK PHOS: 75 U/L (ref 38–126)
ALT: 14 U/L (ref 14–54)
ANION GAP: 8 (ref 5–15)
AST: 16 U/L (ref 15–41)
BILIRUBIN TOTAL: 0.4 mg/dL (ref 0.3–1.2)
BUN: 10 mg/dL (ref 6–20)
CO2: 29 mmol/L (ref 22–32)
Calcium: 9.3 mg/dL (ref 8.9–10.3)
Chloride: 99 mmol/L — ABNORMAL LOW (ref 101–111)
Creatinine, Ser: 1 mg/dL (ref 0.44–1.00)
GFR calc Af Amer: >60 mL/min (ref >60)
GFR calc non Af Amer: >60 mL/min (ref >60)
GLUCOSE: 83 mg/dL (ref 65–99)
Potassium: 4 mmol/L (ref 3.5–5.1)
Sodium: 136 mmol/L (ref 135–145)
Total Protein: 7.7 g/dL (ref 6.5–8.1)

## 2017-09-10 LAB — URINALYSIS, ROUTINE W REFLEX MICROSCOPIC
BACTERIA UA: NONE SEEN
Bilirubin Urine: NEGATIVE
Glucose, UA: NEGATIVE mg/dL
HGB URINE DIPSTICK: NEGATIVE
KETONES UR: NEGATIVE mg/dL
Nitrite: NEGATIVE
PROTEIN: NEGATIVE mg/dL
Specific Gravity, Urine: 1.023 (ref 1.005–1.030)
pH: 5 (ref 5.0–8.0)

## 2017-09-10 LAB — LIPASE, BLOOD: Lipase: 32 U/L (ref 11–51)

## 2017-09-10 LAB — CBC
HEMATOCRIT: 38.2 % (ref 36.0–46.0)
HEMOGLOBIN: 12.2 g/dL (ref 12.0–15.0)
MCH: 27.9 pg (ref 26.0–34.0)
MCHC: 31.9 g/dL (ref 30.0–36.0)
MCV: 87.2 fL (ref 78.0–100.0)
Platelets: 365 10*3/uL (ref 150–400)
RBC: 4.38 MIL/uL (ref 3.87–5.11)
RDW: 13.4 % (ref 11.5–15.5)
WBC: 11.5 10*3/uL — ABNORMAL HIGH (ref 4.0–10.5)

## 2017-09-10 LAB — I-STAT BETA HCG BLOOD, ED (MC, WL, AP ONLY)

## 2017-09-10 MED ORDER — SODIUM CHLORIDE 0.9 % IV BOLUS (SEPSIS)
1000.0000 mL | Freq: Once | INTRAVENOUS | Status: AC
  Administered 2017-09-10: 1000 mL via INTRAVENOUS

## 2017-09-10 MED ORDER — DEXTROSE 5 % IV SOLN
1.0000 g | Freq: Once | INTRAVENOUS | Status: AC
  Administered 2017-09-10: 1 g via INTRAVENOUS
  Filled 2017-09-10: qty 10

## 2017-09-10 MED ORDER — PROMETHAZINE HCL 25 MG RE SUPP: 25.0000 mg | Freq: Four times a day (QID) | RECTAL | 0 refills | Status: DC | PRN

## 2017-09-10 MED ORDER — MORPHINE SULFATE (PF) 4 MG/ML IV SOLN
6.0000 mg | Freq: Once | INTRAVENOUS | Status: AC
  Administered 2017-09-10: 6 mg via INTRAVENOUS
  Filled 2017-09-10: qty 2

## 2017-09-10 MED ORDER — CEPHALEXIN 500 MG PO CAPS: 500.0000 mg | ORAL_CAPSULE | Freq: Three times a day (TID) | ORAL | 0 refills | Status: DC

## 2017-09-10 MED ORDER — HYDROCODONE-ACETAMINOPHEN 5-325 MG PO TABS: 1.0000 | ORAL_TABLET | ORAL | 0 refills | Status: DC | PRN

## 2017-09-10 NOTE — ED Notes (Signed)
Pt. To ultrasound via stretcher

## 2017-09-10 NOTE — ED Provider Notes (Signed)
[facility] Provider [facility]   CSN: [id number] Arrival date & time: 09/09/17  2334     History   Chief Complaint Chief Complaint  Patient presents with  . Abdominal Pain    UTI  . Back Pain    HPI Sylvia Mccann is a 26 y.o. female.  HPI Patient is a 26 year old female who was recently seen in the emergency department for urinary tract infection started on Bactrim.  She was started on antibiotics but did not fill the antibiotics until yesterday.  She presents with worsening bilateral abdominal pain and left flank pain with associated nausea.  No fevers or chills.  No history of kidney stones.  She feels worse than she did.  Still with some urinary frequency.  No other complaints.  Pain is moderate in severity   Past Medical History:  Diagnosis Date  . Asthma   . Migraine     There are no active problems to display for this patient.   History reviewed. No pertinent surgical history.  OB History    No data available       Home Medications    Prior to Admission medications   Medication Sig Start Date End Date Taking? Authorizing Provider  nortriptyline (PAMELOR) 25 MG capsule Take 25 mg by mouth at bedtime.   Yes [provider]  cephALEXin (KEFLEX) 500 MG capsule Take 1 capsule (500 mg total) 3 (three) times daily by mouth. 09/10/17   Azalia Bilisampos, Soua Caltagirone, MD  HYDROcodone-acetaminophen (NORCO/VICODIN) 5-325 MG tablet Take 1 tablet every 4 (four) hours as needed by mouth for moderate pain. 09/10/17   Azalia Bilisampos, Aalaya Yadao, MD  promethazine (PHENERGAN) 25 MG suppository Place 1 suppository (25 mg total) every 6 (six) hours as needed rectally for nausea or vomiting. 09/10/17   Azalia Bilisampos, Alixandra Alfieri, MD    Family History No family history on file.  Social History Social History   Tobacco Use  . Smoking status: Current Every Day Smoker    Packs/day: 0.50    Types: Cigarettes  . Smokeless tobacco: Never Used  Substance Use Topics  .  Alcohol use: No  . Drug use: No     Allergies   Diclofenac potassium   Review of Systems Review of Systems  All other systems reviewed and are negative.    Physical Exam Updated Vital Signs BP (!) 153/88   Pulse 96   LMP 08/27/2017   SpO2 99%   Physical Exam  Constitutional: She is oriented to person, place, and time. She appears well-developed and well-nourished. No distress.  HENT:  Head: Normocephalic and atraumatic.  Eyes: EOM are normal.  Neck: Normal range of motion.  Cardiovascular: Normal rate, regular rhythm and normal heart sounds.  Pulmonary/Chest: Effort normal and breath sounds normal.  Abdominal: Soft. She exhibits no distension. There is no tenderness.  Genitourinary:  Genitourinary Comments: Mild left CVA tenderness  Musculoskeletal: Normal range of motion.  Neurological: She is alert and oriented to person, place, and time.  Skin: Skin is warm and dry.  Psychiatric: She has a normal mood and affect. Judgment normal.  Nursing note and vitals reviewed.    ED Treatments / Results  Labs (all labs ordered are listed, but only abnormal results are displayed) Labs Reviewed  COMPREHENSIVE METABOLIC PANEL - Abnormal; Notable for the following components:      Result Value   Chloride 99 (*)    All other components within normal limits  CBC - Abnormal; Notable for the following components:  WBC 11.5 (*)    All other components within normal limits  URINALYSIS, ROUTINE W REFLEX MICROSCOPIC - Abnormal; Notable for the following components:   APPearance HAZY (*)    Leukocytes, UA SMALL (*)    Squamous Epithelial / LPF 0-5 (*)    All other components within normal limits  URINE CULTURE  LIPASE, BLOOD  I-STAT BETA HCG BLOOD, ED ([facility], [facility], [facility] ONLY)    EKG  EKG Interpretation None       Radiology [place]  Result Date: 09/10/2017 CLINICAL DATA:  Initial evaluation for acute left flank pain, UTI. EXAM: RENAL / URINARY TRACT ULTRASOUND COMPLETE  COMPARISON:  None. FINDINGS: Right Kidney: Length: 10.8 cm. Echogenicity within normal limits. No mass or hydronephrosis visualized. Left Kidney: Length: 11.5 cm. Echogenicity within normal limits. No mass or hydronephrosis visualized. Bladder: Appears normal for degree of bladder distention. Bilateral ureteral jets were visualized. IMPRESSION: Normal renal ultrasound.  No hydronephrosis. Electronically Signed   By: Rise MuBenjamin  McClintock M.D.   On: 09/10/2017 04:59    Procedures Procedures (including critical care time)  Medications Ordered in ED Medications  cefTRIAXone (ROCEPHIN) 1 g in dextrose 5 % 50 mL IVPB (1 g Intravenous New Bag/Given 09/10/17 0418)  morphine 4 MG/ML injection 6 mg (6 mg Intravenous Given 09/10/17 0406)  sodium chloride 0.9 % bolus 1,000 mL (1,000 mLs Intravenous New Bag/Given 09/10/17 0412)     Initial Impression / Assessment and Plan / ED Course  I have reviewed the triage vital signs and the nursing notes.  Pertinent labs & imaging results that were available during my care of the patient were reviewed by me and considered in my medical decision making (see chart for details).     Suspect left-sided pyelonephritis.  Vital signs stable.  IV Rocephin in the emergency department.  Much of this is secondary to delay in obtaining her therapy.  We will discontinue Bactrim and will initiate the patient on Keflex 3 times daily times 7 days.  Home with nausea medication and pain medication.  Urine culture sent.  Patient understands return to the ER for new or worsening symptoms.  Renal ultrasound demonstrates no evidence of hydronephrosis.  Final Clinical Impressions(s) / ED Diagnoses   Final diagnoses:  Pyelonephritis    ED Discharge Orders        Ordered    promethazine (PHENERGAN) 25 MG suppository  Every 6 hours PRN     09/10/17 0512    cephALEXin (KEFLEX) 500 MG capsule  3 times daily     09/10/17 0512    HYDROcodone-acetaminophen (NORCO/VICODIN) 5-325 MG  tablet  Every 4 hours PRN     09/10/17 [id number]       Azalia Bilisampos, Alayziah Tangeman, MD 09/10/17 [phone]

## 2017-09-11 LAB — URINE CULTURE

## 2017-10-21 ENCOUNTER — Emergency Department (HOSPITAL_COMMUNITY)
Admission: EM | Admit: 2017-10-21 | Discharge: 2017-10-22 | Disposition: A | Discharge status: Other Acute Inpatient Hospital | Payer: Self-pay | Attending: Emergency Medicine | Admitting: Emergency Medicine

## 2017-10-21 ENCOUNTER — Encounter (HOSPITAL_COMMUNITY): Payer: Self-pay

## 2017-10-21 DIAGNOSIS — T50902A Poisoning by unspecified drugs, medicaments and biological substances, intentional self-harm, initial encounter: Secondary | ICD-10-CM

## 2017-10-21 DIAGNOSIS — R45851 Suicidal ideations: Secondary | ICD-10-CM | POA: Insufficient documentation

## 2017-10-21 DIAGNOSIS — X838XXA Intentional self-harm by other specified means, initial encounter: Secondary | ICD-10-CM | POA: Insufficient documentation

## 2017-10-21 DIAGNOSIS — F322 Major depressive disorder, single episode, severe without psychotic features: Secondary | ICD-10-CM | POA: Diagnosis present

## 2017-10-21 DIAGNOSIS — T426X2A Poisoning by other antiepileptic and sedative-hypnotic drugs, intentional self-harm, initial encounter: Secondary | ICD-10-CM | POA: Insufficient documentation

## 2017-10-21 DIAGNOSIS — J45909 Unspecified asthma, uncomplicated: Secondary | ICD-10-CM | POA: Insufficient documentation

## 2017-10-21 DIAGNOSIS — Z79899 Other long term (current) drug therapy: Secondary | ICD-10-CM | POA: Insufficient documentation

## 2017-10-21 DIAGNOSIS — F17210 Nicotine dependence, cigarettes, uncomplicated: Secondary | ICD-10-CM | POA: Insufficient documentation

## 2017-10-21 DIAGNOSIS — Z659 Problem related to unspecified psychosocial circumstances: Secondary | ICD-10-CM | POA: Insufficient documentation

## 2017-10-21 LAB — COMPREHENSIVE METABOLIC PANEL
ALK PHOS: 82 U/L (ref 38–126)
ALT: 12 U/L — AB (ref 14–54)
AST: 17 U/L (ref 15–41)
Albumin: 5.3 g/dL — ABNORMAL HIGH (ref 3.5–5.0)
Anion gap: 11 (ref 5–15)
BUN: 14 mg/dL (ref 6–20)
CALCIUM: 10.1 mg/dL (ref 8.9–10.3)
CHLORIDE: 101 mmol/L (ref 101–111)
CO2: 26 mmol/L (ref 22–32)
CREATININE: 1.07 mg/dL — AB (ref 0.44–1.00)
GFR calc non Af Amer: >60 mL/min (ref >60)
Glucose, Bld: 88 mg/dL (ref 65–99)
Potassium: 3.5 mmol/L (ref 3.5–5.1)
SODIUM: 138 mmol/L (ref 135–145)
Total Bilirubin: 0.8 mg/dL (ref 0.3–1.2)
Total Protein: 9.9 g/dL — ABNORMAL HIGH (ref 6.5–8.1)

## 2017-10-21 LAB — CBC WITH DIFFERENTIAL/PLATELET
Basophils Absolute: 0 10*3/uL (ref 0.0–0.1)
Basophils Relative: 0 %
EOS PCT: 2 %
Eosinophils Absolute: 0.2 10*3/uL (ref 0.0–0.7)
HEMATOCRIT: 44.5 % (ref 36.0–46.0)
Hemoglobin: 14.5 g/dL (ref 12.0–15.0)
LYMPHS ABS: 1.6 10*3/uL (ref 0.7–4.0)
LYMPHS PCT: 18 %
MCH: 28 pg (ref 26.0–34.0)
MCHC: 32.6 g/dL (ref 30.0–36.0)
MCV: 85.9 fL (ref 78.0–100.0)
MONO ABS: 0.4 10*3/uL (ref 0.1–1.0)
MONOS PCT: 5 %
NEUTROS ABS: 6.7 10*3/uL (ref 1.7–7.7)
Neutrophils Relative %: 75 %
PLATELETS: 296 10*3/uL (ref 150–400)
RBC: 5.18 MIL/uL — ABNORMAL HIGH (ref 3.87–5.11)
RDW: 13.3 % (ref 11.5–15.5)
WBC: 8.9 10*3/uL (ref 4.0–10.5)

## 2017-10-21 LAB — RAPID URINE DRUG SCREEN, HOSP PERFORMED
Amphetamines: NOT DETECTED
BENZODIAZEPINES: NOT DETECTED
Barbiturates: NOT DETECTED
Cocaine: POSITIVE — AB
OPIATES: NOT DETECTED
Tetrahydrocannabinol: NOT DETECTED

## 2017-10-21 LAB — URINALYSIS, ROUTINE W REFLEX MICROSCOPIC
BACTERIA UA: NONE SEEN
Bilirubin Urine: NEGATIVE
GLUCOSE, UA: NEGATIVE mg/dL
KETONES UR: 20 mg/dL — AB
Nitrite: NEGATIVE
PROTEIN: 100 mg/dL — AB
Specific Gravity, Urine: 1.032 — ABNORMAL HIGH (ref 1.005–1.030)
pH: 5 (ref 5.0–8.0)

## 2017-10-21 LAB — I-STAT BETA HCG BLOOD, ED (MC, WL, AP ONLY): I-stat hCG, quantitative: <5 m[IU]/mL (ref <5)

## 2017-10-21 MED ORDER — HYDROXYZINE HCL 50 MG/ML IM SOLN
50.0000 mg | Freq: Four times a day (QID) | INTRAMUSCULAR | Status: DC | PRN
Start: 2017-10-21 — End: 2017-10-21

## 2017-10-21 MED ORDER — HYDROXYZINE HCL 25 MG PO TABS
50.0000 mg | ORAL_TABLET | Freq: Four times a day (QID) | ORAL | Status: DC | PRN
  Administered 2017-10-21: 50 mg via ORAL
  Filled 2017-10-21: qty 2

## 2017-10-21 MED ORDER — ONDANSETRON HCL 4 MG PO TABS: 4.0000 mg | ORAL_TABLET | Freq: Three times a day (TID) | ORAL | Status: DC | PRN

## 2017-10-21 MED ORDER — ACETAMINOPHEN 325 MG PO TABS
650.0000 mg | ORAL_TABLET | ORAL | Status: DC | PRN
  Administered 2017-10-21 – 2017-10-22 (×2): 650 mg via ORAL
  Filled 2017-10-21 (×2): qty 2

## 2017-10-21 NOTE — ED Provider Notes (Signed)
[facility]-EMERGENCY DEPT Provider Note   CSN: [id number] Arrival date & time: 10/21/17  1919     History   Chief Complaint Chief Complaint  Patient presents with  . Drug Overdose    HPI Sylvia Mccann is a 26 y.o. female.  HPI She reports that she has been under a lot of stress lately.  She reports she was having a migraine and took her father's Neurontin.  She reports that she only took 2 of them but she had poured out more in her hand.  She states that really she was only trying to treat her migraine but she was upset and said that she wished she would die.  She denies a desire to kill herself.  She also affirms that she only took 2 of the pills although apparently it was determined that 5 others were missing with a conclusion that she took 7.  She reports that she does not have a history of drug or alcohol abuse however she did try cocaine for the first time last week. Past Medical History:  Diagnosis Date  . Asthma   . Migraine     There are no active problems to display for this patient.   History reviewed. No pertinent surgical history.  OB History    No data available       Home Medications    Prior to Admission medications   Medication Sig Start Date End Date Taking? Authorizing Provider  acetaminophen (TYLENOL) 500 MG tablet Take 1,000 mg by mouth every 6 (six) hours as needed for moderate pain.   Yes [provider]  gabapentin (NEURONTIN) 300 MG capsule Take 300 mg by mouth once.   Yes [provider]  ibuprofen (ADVIL,MOTRIN) 200 MG tablet Take 400 mg by mouth every 6 (six) hours as needed for moderate pain.   Yes [provider]  nortriptyline (PAMELOR) 25 MG capsule Take 25 mg by mouth at bedtime.   Yes [provider]  cephALEXin (KEFLEX) 500 MG capsule Take 1 capsule (500 mg total) 3 (three) times daily by mouth. Patient not taking: Reported on 10/21/2017 09/10/17   Azalia Bilisampos, Kevin, MD    HYDROcodone-acetaminophen (NORCO/VICODIN) 5-325 MG tablet Take 1 tablet every 4 (four) hours as needed by mouth for moderate pain. Patient not taking: Reported on 10/21/2017 09/10/17   Azalia Bilisampos, Kevin, MD  promethazine (PHENERGAN) 25 MG suppository Place 1 suppository (25 mg total) every 6 (six) hours as needed rectally for nausea or vomiting. 09/10/17   Azalia Bilisampos, Kevin, MD    Family History No family history on file.  Social History Social History   Tobacco Use  . Smoking status: Current Every Day Smoker    Packs/day: 0.50    Types: Cigarettes  . Smokeless tobacco: Never Used  Substance Use Topics  . Alcohol use: No  . Drug use: No     Allergies   Diclofenac potassium   Review of Systems Review of Systems 10 Systems reviewed and are negative for acute change except as noted in the HPI.   Physical Exam Updated Vital Signs BP 118/73   Pulse 75   Temp 98.5 F (36.9 C) (Oral)   Resp 18   SpO2 100%   Physical Exam  Constitutional: She is oriented to person, place, and time. She appears well-developed and well-nourished. No distress.  HENT:  Head: Normocephalic and atraumatic.  Nose: Nose normal.  Mouth/Throat: Oropharynx is clear and moist.  Eyes: Conjunctivae and EOM are normal. Pupils are  equal, round, and reactive to light.  Neck: Neck supple.  Cardiovascular: Normal rate and regular rhythm.  No murmur heard. Pulmonary/Chest: Effort normal and breath sounds normal. No respiratory distress.  Abdominal: Soft. There is no tenderness.  Musculoskeletal: She exhibits no edema or tenderness.  Neurological: She is alert and oriented to person, place, and time. No cranial nerve deficit. She exhibits normal muscle tone. Coordination normal.  Skin: Skin is warm and dry.  Psychiatric: She has a normal mood and affect.  Nursing note and vitals reviewed.    ED Treatments / Results  Labs (all labs ordered are listed, but only abnormal results are displayed) Labs Reviewed   COMPREHENSIVE METABOLIC PANEL - Abnormal; Notable for the following components:      Result Value   Creatinine, Ser 1.07 (*)    Total Protein 9.9 (*)    Albumin 5.3 (*)    ALT 12 (*)    All other components within normal limits  ACETAMINOPHEN LEVEL - Abnormal; Notable for the following components:   Acetaminophen (Tylenol), Serum <10 (*)    All other components within normal limits  CBC WITH DIFFERENTIAL/PLATELET - Abnormal; Notable for the following components:   RBC 5.18 (*)    All other components within normal limits  URINALYSIS, ROUTINE W REFLEX MICROSCOPIC - Abnormal; Notable for the following components:   Specific Gravity, Urine 1.032 (*)    Hgb urine dipstick LARGE (*)    Ketones, ur 20 (*)    Protein, ur 100 (*)    Leukocytes, UA SMALL (*)    Squamous Epithelial / LPF 0-5 (*)    All other components within normal limits  RAPID URINE DRUG SCREEN, HOSP PERFORMED - Abnormal; Notable for the following components:   Cocaine POSITIVE (*)    All other components within normal limits  ETHANOL  I-STAT BETA HCG BLOOD, ED ([facility], [facility], [facility] ONLY)    EKG  EKG Interpretation  Date/Time:  Thursday October 21 2017 19:32:33 EST Ventricular Rate:  70 PR Interval:    QRS Duration: 84 QT Interval:  369 QTC Calculation: 399 R Axis:   77 Text Interpretation:  Sinus rhythm Baseline wander in lead(s) V5 no change from previous Confirmed by Arby BarrettePfeiffer, Borden Thune [phone]([id number]) on 10/21/2017 9:49:35 PM       Radiology No results found.  Procedures Procedures (including critical care time)  Medications Ordered in ED Medications - No data to display   Initial Impression / Assessment and Plan / ED Course  I have reviewed the triage vital signs and the nursing notes.  Pertinent labs & imaging results that were available during my care of the patient were reviewed by me and considered in my medical decision making (see chart for details).      Final Clinical Impressions(s) / ED Diagnoses    Final diagnoses:  Suicide gesture, initial encounter [facility](HCC)  Other social stressor  Intentional drug overdose, initial encounter [facility](HCC)   Patient is asymptomatic from ingestion of Neurontin.  She reports only taking 2 tablets.  Reportedly there is suspicion she took 7.  Patient has remained alert and normal.  She is interacting with family members.  She has stable vital signs otherwise negative review of systems.  He does identify social stressors and having stated suicide thoughts.  At this time, patient is medically cleared for psychiatric evaluation.  ED Discharge Orders    None       Arby BarrettePfeiffer, Remmy Crass, MD 10/21/17 2207

## 2017-10-21 NOTE — ED Triage Notes (Signed)
Pt comes to ed via ems, drug overdose 7 ( 300mg  tabs missing from  Non prescribed bottle found by grand father. Occurrence at 6pm. Medication described as neurontin. Pt verbalizes wants harm herself.pt with [employer] and voluntary. V/s on arrival 115/70 Hr88, rr16, cbg 171.

## 2017-10-21 NOTE — BH Assessment (Addendum)
Tele Assessment Note   Patient Name: Sylvia Mccann MRN: [id number] Referring Physician: Dr. Donnald GarrePfeiffer [facility]  Location of Provider: Behavioral Health TTS Department  Sylvia Mccann is an 26 y.o. female. The pt came in after it was reported she overdosed on 7 pills of Neurontin.  The pt stated she had a migraine and took them to stop the headache pain.  The pt states she spit out the 5 pills and only took 2 pills.  She denies current SI and denies taking the pills were a suicide attempt.  She did state she was suicidal "a couple of days ago", but never had a plan.  She denies any previous suicide attempt and any history of self harm.  She has not been hospitalized for mental reasons in the past and is not receiving mental health treatment.  According to a previous note, the pt recently broke up with her boyfriend about 2 weeks ago.  When asked about the ex boyfriend she responded, "Did I say that?  I never said that was an issue."  The pt reported she doesn't have any stressors.    She complained of getting about 5 hours of sleep at night and not having an appetite.  She thinks she has lost weight, but not sure how much weight she has lost.  She also complained of crying spells, isolating, lack of motivation, irritability and hopelessness.   She denies SA use, but her UDS was positive for cocaine.  She told the MD she used cocaine once last week.  The pt was irritable and guarded during most of the assessment.  The pt denies current HI and psychosis.   Diagnosis:F32.2 Major Depressive Disorder, Single Episode, Severe  Past Medical History:  Past Medical History:  Diagnosis Date  . Asthma   . Migraine     History reviewed. No pertinent surgical history.  Family History: No family history on file.  Social History:  reports that she has been smoking cigarettes.  She has been smoking about 0.50 packs per day. she has never used smokeless tobacco. She reports that she does  not drink alcohol or use drugs.  Additional Social History:  Alcohol / Drug Use Prescriptions: See MAR History of alcohol / drug use?: Yes Longest period of sobriety (when/how long): unknown Substance #1 Name of Substance 1: cocaine 1 - Age of First Use: 26 1 - Frequency: unknown 1 - Last Use / Amount: reported last use was a week ago to MD.  CIWA: CIWA-Ar BP: 118/73 Pulse Rate: 74 COWS:    PATIENT STRENGTHS: (choose at least two) Ability for insight Active sense of humor Average or above average intelligence Communication skills  Allergies:  Allergies  Allergen Reactions  . Diclofenac Potassium Other (See Comments)    'MADE ME FEEL LIKE MY HEAD WAS ON FIRE"    Home Medications:  (Not in a hospital admission)  OB/GYN Status:  No LMP recorded.  General Assessment Data Location of Assessment: [facility] TTS Assessment: In system Is this a Tele or Face-to-Face Assessment?: Tele Assessment Is this an Initial Assessment or a Re-assessment for this encounter?: Initial Assessment Marital status: Single Maiden name: NA Is patient pregnant?: No Pregnancy Status: No Living Arrangements: Alone Can pt return to current living arrangement?: Yes Admission Status: Voluntary Is patient capable of signing voluntary admission?: Yes Referral Source: Self/Family/Friend Insurance type: none     Crisis Care Plan Living Arrangements: Alone Legal Guardian: Other:(Self) Name of Psychiatrist: none Name of  Therapist: none  Education Status Is patient currently in school?: No Current Grade: NA Highest grade of school patient has completed: 12th Name of school: NA Contact person: NA  Risk to self with the past 6 months Suicidal Ideation: No-Not Currently/Within Last 6 Months Has patient been a risk to self within the past 6 months prior to admission? : No Suicidal Intent: No-Not Currently/Within Last 6 Months Has patient had any suicidal intent within the past 6 months prior to  admission? : Yes Is patient at risk for suicide?: Yes Suicidal Plan?: No Has patient had any suicidal plan within the past 6 months prior to admission? : No Access to Means: No What has been your use of drugs/alcohol within the last 12 months?: denied cocaine use.  UDS pos for cocaine told MD tried for the first time last week. Previous Attempts/Gestures: No How many times?: 0 Other Self Harm Risks: none Triggers for Past Attempts: Other personal contacts Intentional Self Injurious Behavior: None Family Suicide History: No Recent stressful life event(s): Loss (Comment)(boyfriend broke up with her) Persecutory voices/beliefs?: No Depression: Yes Depression Symptoms: Insomnia, Tearfulness, Isolating, Loss of interest in usual pleasures, Feeling worthless/self pity, Feeling angry/irritable Substance abuse history and/or treatment for substance abuse?: No Suicide prevention information given to non-admitted patients: Not applicable  Risk to Others within the past 6 months Homicidal Ideation: No Does patient have any lifetime risk of violence toward others beyond the six months prior to admission? : No Thoughts of Harm to Others: No Current Homicidal Intent: No Current Homicidal Plan: No Access to Homicidal Means: No Identified Victim: NA History of harm to others?: No Assessment of Violence: None Noted Violent Behavior Description: none Does patient have access to weapons?: No Criminal Charges Pending?: No Does patient have a court date: No Is patient on probation?: No  Psychosis Hallucinations: None noted Delusions: None noted  Mental Status Report Appearance/Hygiene: In hospital gown Eye Contact: Fair Motor Activity: Freedom of movement, Unremarkable Speech: Logical/coherent, Unremarkable Level of Consciousness: Alert, Irritable Mood: Depressed, Irritable Affect: Depressed, Irritable Anxiety Level: None Thought Processes: Coherent, Relevant Judgement:  Partial Orientation: Person, Place, Time, Situation, Appropriate for developmental age Obsessive Compulsive Thoughts/Behaviors: None  Cognitive Functioning Memory: Recent Intact, Remote Intact IQ: Average Insight: Poor Impulse Control: Poor Appetite: Poor Weight Loss: 0 Weight Gain: 0 Sleep: Decreased Total Hours of Sleep: 5 Vegetative Symptoms: None  ADLScreening [facility] Assessment Services) Patient's cognitive ability adequate to safely complete daily activities?: Yes Patient able to express need for assistance with ADLs?: Yes Independently performs ADLs?: Yes (appropriate for developmental age)  Prior Inpatient Therapy Prior Inpatient Therapy: No Prior Therapy Dates: NA Prior Therapy Facilty/Provider(s): NA Reason for Treatment: NA  Prior Outpatient Therapy Prior Outpatient Therapy: No Prior Therapy Dates: NA Prior Therapy Facilty/Provider(s): NA Reason for Treatment: NA Does patient have an ACCT team?: No Does patient have Intensive In-House Services?  : No Does patient have [facility] services? : No Does patient have P4CC services?: No  ADL Screening (condition at time of admission) Patient's cognitive ability adequate to safely complete daily activities?: Yes Patient able to express need for assistance with ADLs?: Yes Independently performs ADLs?: Yes (appropriate for developmental age)       Abuse/Neglect Assessment (Assessment to be complete while patient is alone) Abuse/Neglect Assessment Can Be Completed: Yes Physical Abuse: Denies Verbal Abuse: Denies Sexual Abuse: Denies Exploitation of patient/patient's resources: Denies Self-Neglect: Denies Values / Beliefs Cultural Requests During Hospitalization: None Spiritual Requests During Hospitalization: None Consults Spiritual Care  Consult Needed: No Social Work Consult Needed: No Merchant navy officerAdvance Directives (For Healthcare) Does Patient Have a Programmer, multimediaMedical Advance Directive?: No Would patient like information on  creating a medical advance directive?: No - Patient declined    Additional Information 1:1 In Past 12 Months?: No CIRT Risk: No Elopement Risk: No Does patient have medical clearance?: Yes     Disposition:  Disposition Initial Assessment Completed for this Encounter: Yes Disposition of Patient: Pending Review with psychiatrist   Pt recommended for inpatient per Nira ConnJason Berry, NP.  Spoke with pt's MD Arby BarretteMarcy Pfeiffer and reported to her the recommendation.  This service was provided via telemedicine using a 2-way, interactive audio and video technology.  Names of all persons participating in this telemedicine service and their role in this encounter. Name: Riley ChurchesKendall Termaine Roupp Role: TTS  Name: Sylvia Mccann Role: patient  Name:  Role:   Name:  Role:     Ottis StainGarvin, Kery Haltiwanger Jermaine 10/21/2017 10:38 PM

## 2017-10-21 NOTE — ED Triage Notes (Signed)
One episode of vomiting.

## 2017-10-21 NOTE — ED Notes (Addendum)
Per patient's god father patient recently split with her boyfriend about 2 weeks ago and has been threatening to kill herself since.  Ex-boyfriend has called out 3 wellness checks on her since last week. Pt eventually refused to open the door for [employer].   This evening she was at her god father's house and he found her getting into his medicine cabinet to where the patient took approx. Seven 300 mg tablets of gabapentin around 1830.  One episode of emesis. Godfather's information Casimiro NeedleMichael [phone]

## 2017-10-22 ENCOUNTER — Other Ambulatory Visit: Payer: Self-pay

## 2017-10-22 ENCOUNTER — Inpatient Hospital Stay (HOSPITAL_COMMUNITY)
Admission: AD | Admit: 2017-10-22 | Discharge: 2017-10-24 | DRG: 885 | Disposition: A | Discharge status: Home / Self Care | Payer: Federal, State, Local not specified - Other | Source: Intra-hospital | Attending: Psychiatry | Admitting: Psychiatry

## 2017-10-22 ENCOUNTER — Encounter (HOSPITAL_COMMUNITY): Payer: Self-pay

## 2017-10-22 DIAGNOSIS — R51 Headache: Secondary | ICD-10-CM

## 2017-10-22 DIAGNOSIS — F17210 Nicotine dependence, cigarettes, uncomplicated: Secondary | ICD-10-CM | POA: Diagnosis present

## 2017-10-22 DIAGNOSIS — T426X2A Poisoning by other antiepileptic and sedative-hypnotic drugs, intentional self-harm, initial encounter: Secondary | ICD-10-CM

## 2017-10-22 DIAGNOSIS — T1491XA Suicide attempt, initial encounter: Secondary | ICD-10-CM

## 2017-10-22 DIAGNOSIS — F322 Major depressive disorder, single episode, severe without psychotic features: Secondary | ICD-10-CM | POA: Diagnosis present

## 2017-10-22 DIAGNOSIS — J45909 Unspecified asthma, uncomplicated: Secondary | ICD-10-CM | POA: Diagnosis present

## 2017-10-22 DIAGNOSIS — G43909 Migraine, unspecified, not intractable, without status migrainosus: Secondary | ICD-10-CM | POA: Diagnosis present

## 2017-10-22 DIAGNOSIS — Z888 Allergy status to other drugs, medicaments and biological substances status: Secondary | ICD-10-CM | POA: Diagnosis not present

## 2017-10-22 DIAGNOSIS — G4700 Insomnia, unspecified: Secondary | ICD-10-CM | POA: Diagnosis present

## 2017-10-22 LAB — ETHANOL: Alcohol, Ethyl (B): <10 mg/dL (ref <10)

## 2017-10-22 LAB — ACETAMINOPHEN LEVEL: Acetaminophen (Tylenol), Serum: <10 ug/mL — ABNORMAL LOW (ref 10–30)

## 2017-10-22 MED ORDER — DIPHENHYDRAMINE HCL 25 MG PO CAPS: 50.0000 mg | ORAL_CAPSULE | Freq: Once | ORAL | Status: DC

## 2017-10-22 MED ORDER — GABAPENTIN 300 MG PO CAPS
300.0000 mg | ORAL_CAPSULE | Freq: Three times a day (TID) | ORAL | Status: DC
  Filled 2017-10-22: qty 1

## 2017-10-22 MED ORDER — DIPHENHYDRAMINE HCL 50 MG/ML IJ SOLN
50.0000 mg | Freq: Once | INTRAMUSCULAR | Status: DC
  Filled 2017-10-22: qty 1

## 2017-10-22 MED ORDER — CITALOPRAM HYDROBROMIDE 10 MG PO TABS
10.0000 mg | ORAL_TABLET | Freq: Every day | ORAL | Status: DC
  Filled 2017-10-22: qty 1

## 2017-10-22 MED ORDER — GABAPENTIN 300 MG PO CAPS
300.0000 mg | ORAL_CAPSULE | Freq: Three times a day (TID) | ORAL | Status: DC
  Filled 2017-10-22 (×3): qty 1

## 2017-10-22 MED ORDER — OLANZAPINE 10 MG PO TABS: 10.0000 mg | ORAL_TABLET | Freq: Once | ORAL | Status: DC

## 2017-10-22 MED ORDER — TRAZODONE HCL 50 MG PO TABS: 50.0000 mg | ORAL_TABLET | Freq: Every evening | ORAL | Status: DC | PRN

## 2017-10-22 MED ORDER — ACETAMINOPHEN 325 MG PO TABS
650.0000 mg | ORAL_TABLET | ORAL | Status: DC | PRN
  Administered 2017-10-23 – 2017-10-24 (×3): 650 mg via ORAL
  Filled 2017-10-22 (×3): qty 2

## 2017-10-22 MED ORDER — HYDROXYZINE HCL 50 MG PO TABS
50.0000 mg | ORAL_TABLET | Freq: Four times a day (QID) | ORAL | Status: DC | PRN
  Administered 2017-10-23 (×2): 50 mg via ORAL
  Filled 2017-10-22 (×2): qty 1

## 2017-10-22 MED ORDER — ALUM & MAG HYDROXIDE-SIMETH 200-200-20 MG/5ML PO SUSP: 30.0000 mL | ORAL | Status: DC | PRN

## 2017-10-22 MED ORDER — OLANZAPINE 10 MG IM SOLR
10.0000 mg | Freq: Once | INTRAMUSCULAR | Status: DC
  Filled 2017-10-22: qty 10

## 2017-10-22 MED ORDER — TRAZODONE HCL 50 MG PO TABS
50.0000 mg | ORAL_TABLET | Freq: Every evening | ORAL | Status: DC | PRN
  Filled 2017-10-22: qty 1

## 2017-10-22 MED ORDER — ONDANSETRON HCL 4 MG PO TABS: 4.0000 mg | ORAL_TABLET | Freq: Three times a day (TID) | ORAL | Status: DC | PRN

## 2017-10-22 MED ORDER — MAGNESIUM HYDROXIDE 400 MG/5ML PO SUSP: 30.0000 mL | Freq: Every day | ORAL | Status: DC | PRN

## 2017-10-22 MED ORDER — STERILE WATER FOR INJECTION IJ SOLN
INTRAMUSCULAR | Status: AC
  Administered 2017-10-22: 2.1 mL
  Filled 2017-10-22: qty 10

## 2017-10-22 NOTE — ED Notes (Signed)
With security as backup, nurse let patient know that she was not going to be discharged and we were looking for placement for her.  Patient said, "Oh no I'm voluntary, the nurse practitioner is going to call my Godfather."  Reminded patient that she is IVC'd and would be hospitalized for a few days.  Patient remained calm and immediately went to use the telephone.  A few minutes later she came up to nurse's station and asked if there was someone else she could call.  Patient continues to use the phone grimacing and hanging up the receiver loudly.

## 2017-10-22 NOTE — Progress Notes (Addendum)
Sylvia Mccann is a [age] year old female being admitted involuntarily to [facility] from [facility].  She came in to the ED with suicidal ideation and intentional OD on Gabapentin.  She made statements to her father that she wanted to die and he found that she ingested gabapentin and called the police.  Recent break up with fiance as another stressor.  She was agitated in the ED because she didn't want to be admitted.  [facility], she was severely agitated.  Refusing to answer questions.  Would curse at staff when trying to explain the admission process.  She refused to sign paperwork.  She did complete skin assessment (no skin issues noted) but was cursing staff the entire time.  When she was being walked on the unit, RN tried to give her tour of the unit and she stated "I am tired of hearing your voice and you need shut the fuck up."  She ripped up her code card and refused to listen to staff anymore.  Belongings searched and secured in locker # 31.  Q 15 minute checks initiated for safety.  We will monitor the progress towards her goals.

## 2017-10-22 NOTE — ED Notes (Signed)
Patient continues to be labile and irritable at this time. Patient has pulled the code blue button several times after she was informed several times that it was not the call bell.

## 2017-10-22 NOTE — Consult Note (Signed)
[facility] Face-to-Face Psychiatry Consult   Reason for Consult:  Overdose, intentional Referring Physician:  EDP Patient Identification: Sylvia Mccann MRN:  [id number] Principal Diagnosis: Major depressive disorder, single episode, severe without psychotic features [facility]) Diagnosis:   Patient Active Problem List   Diagnosis Date Noted  . Major depressive disorder, single episode, severe without psychotic features ([place]) [F32.2] 10/22/2017    Priority: High    Total Time spent with patient: 45 minutes  Subjective:   Sylvia Mccann is a 26 y.o. female patient admitted with intentional overdose.  HPI:  26 yo female who came to the ED after overdosing on Gabapentin.  On assessment, she minimizes her actions and states she had two because of a headache.  Also, reported suicidal threats due to her headache.  Irritable on assessment, demanding to leave but did calm down without medications.  Her Godfather was contacted with her permission and reports she has been threatening suicide frequently lately.  Yesterday, her exboyfriend came to get some things and they had an altercation.  She went and took a handful of Gabapentin, spit some out but 7 pills missing.  Her Godfather feels she needs a minimum of an overnight stay to stop her from making suicidal threats.  No homicidal ideations or hallucinations. Reports using cocaine for the first time last week but positive on admission.  Inpatient hospitalization needed.  Past Psychiatric History: none  Risk to Self: Suicidal Ideation: No-Not Currently/Within Last 6 Months Suicidal Intent: No-Not Currently/Within Last 6 Months Is patient at risk for suicide?: Yes Suicidal Plan?: No Access to Means: No What has been your use of drugs/alcohol within the last 12 months?: denied cocaine use.  UDS pos for cocaine told MD tried for the first time last week. How many times?: 0 Other Self Harm Risks: none Triggers for Past Attempts: Other personal  contacts Intentional Self Injurious Behavior: None Risk to Others: Homicidal Ideation: No Thoughts of Harm to Others: No Current Homicidal Intent: No Current Homicidal Plan: No Access to Homicidal Means: No Identified Victim: NA History of harm to others?: No Assessment of Violence: None Noted Violent Behavior Description: none Does patient have access to weapons?: No Criminal Charges Pending?: No Does patient have a court date: No Prior Inpatient Therapy: Prior Inpatient Therapy: No Prior Therapy Dates: NA Prior Therapy Facilty/Provider(s): NA Reason for Treatment: NA Prior Outpatient Therapy: Prior Outpatient Therapy: No Prior Therapy Dates: NA Prior Therapy Facilty/Provider(s): NA Reason for Treatment: NA Does patient have an ACCT team?: No Does patient have Intensive In-House Services?  : No Does patient have [facility] services? : No Does patient have P4CC services?: No  Past Medical History:  Past Medical History:  Diagnosis Date  . Asthma   . Migraine    History reviewed. No pertinent surgical history. Family History: No family history on file. Family Psychiatric  History: none Social History:  Social History   Substance and Sexual Activity  Alcohol Use No     Social History   Substance and Sexual Activity  Drug Use No    Social History   Socioeconomic History  . Marital status: Single    Spouse name: None  . Number of children: None  . Years of education: None  . Highest education level: None  Social Needs  . Financial resource strain: None  . Food insecurity - worry: None  . Food insecurity - inability: None  . Transportation needs - medical: None  . Transportation needs - non-medical: None  Occupational History  .  None  Tobacco Use  . Smoking status: Current Every Day Smoker    Packs/day: 0.50    Types: Cigarettes  . Smokeless tobacco: Never Used  Substance and Sexual Activity  . Alcohol use: No  . Drug use: No  . Sexual activity: Yes     Birth control/protection: None  Other Topics Concern  . None  Social History Narrative  . None   Additional Social History: N/A    Allergies:   Allergies  Allergen Reactions  . Diclofenac Potassium Other (See Comments)    'MADE ME FEEL LIKE MY HEAD WAS ON FIRE"    Labs:  Results for orders placed or performed during the hospital encounter of 10/21/17 (from the past 48 hour(s))  Comprehensive metabolic panel     Status: Abnormal   Collection Time: 10/21/17  8:02 PM  Result Value Ref Range   Sodium 138 135 - 145 mmol/L   Potassium 3.5 3.5 - 5.1 mmol/L   Chloride 101 101 - 111 mmol/L   CO2 26 22 - 32 mmol/L   Glucose, Bld 88 65 - 99 mg/dL   BUN 14 6 - 20 mg/dL   Creatinine, Ser 1.07 (H) 0.44 - 1.00 mg/dL   Calcium 10.1 8.9 - 10.3 mg/dL   Total Protein 9.9 (H) 6.5 - 8.1 g/dL   Albumin 5.3 (H) 3.5 - 5.0 g/dL   AST 17 15 - 41 U/L   ALT 12 (L) 14 - 54 U/L   Alkaline Phosphatase 82 38 - 126 U/L   Total Bilirubin 0.8 0.3 - 1.2 mg/dL   GFR calc non Af Amer >60 >60 mL/min   GFR calc Af Amer >60 >60 mL/min    Comment: (NOTE) The eGFR has been calculated using the CKD EPI equation. This calculation has not been validated in all clinical situations. eGFR's persistently <60 mL/min signify possible Chronic Kidney Disease.    Anion gap 11 5 - 15  Acetaminophen level     Status: Abnormal   Collection Time: 10/21/17  8:02 PM  Result Value Ref Range   Acetaminophen (Tylenol), Serum <10 (L) 10 - 30 ug/mL  Ethanol     Status: None   Collection Time: 10/21/17  8:02 PM  Result Value Ref Range   Alcohol, Ethyl (B) <10 <10 mg/dL  CBC with Differential     Status: Abnormal   Collection Time: 10/21/17  8:02 PM  Result Value Ref Range   WBC 8.9 4.0 - 10.5 K/uL   RBC 5.18 (H) 3.87 - 5.11 MIL/uL   Hemoglobin 14.5 12.0 - 15.0 g/dL   HCT 44.5 36.0 - 46.0 %   MCV 85.9 78.0 - 100.0 fL   MCH 28.0 26.0 - 34.0 pg   MCHC 32.6 30.0 - 36.0 g/dL   RDW 13.3 11.5 - 15.5 %   Platelets 296 150 - 400  K/uL   Neutrophils Relative % 75 %   Neutro Abs 6.7 1.7 - 7.7 K/uL   Lymphocytes Relative 18 %   Lymphs Abs 1.6 0.7 - 4.0 K/uL   Monocytes Relative 5 %   Monocytes Absolute 0.4 0.1 - 1.0 K/uL   Eosinophils Relative 2 %   Eosinophils Absolute 0.2 0.0 - 0.7 K/uL   Basophils Relative 0 %   Basophils Absolute 0.0 0.0 - 0.1 K/uL  I-Stat beta hCG blood, ED     Status: None   Collection Time: 10/21/17  8:28 PM  Result Value Ref Range   I-stat hCG, quantitative <5.0 <5 mIU/mL  Comment 3            Comment:   GEST. AGE      CONC.  (mIU/mL)   <=1 WEEK        5 - 50     2 WEEKS       50 - 500     3 WEEKS       100 - 10,000     4 WEEKS     1,000 - 30,000        FEMALE AND NON-PREGNANT FEMALE:     LESS THAN 5 mIU/mL   Urinalysis, Routine w reflex microscopic     Status: Abnormal   Collection Time: 10/21/17  8:35 PM  Result Value Ref Range   Color, Urine YELLOW YELLOW   APPearance CLEAR CLEAR   Specific Gravity, Urine 1.032 (H) 1.005 - 1.030   pH 5.0 5.0 - 8.0   Glucose, UA NEGATIVE NEGATIVE mg/dL   Hgb urine dipstick LARGE (A) NEGATIVE   Bilirubin Urine NEGATIVE NEGATIVE   Ketones, ur 20 (A) NEGATIVE mg/dL   Protein, ur 100 (A) NEGATIVE mg/dL   Nitrite NEGATIVE NEGATIVE   Leukocytes, UA SMALL (A) NEGATIVE   RBC / HPF 0-5 0 - 5 RBC/hpf   WBC, UA 0-5 0 - 5 WBC/hpf   Bacteria, UA NONE SEEN NONE SEEN   Squamous Epithelial / LPF 0-5 (A) NONE SEEN   Mucus PRESENT   Urine rapid drug screen (hosp performed)     Status: Abnormal   Collection Time: 10/21/17  8:35 PM  Result Value Ref Range   Opiates NONE DETECTED NONE DETECTED   Cocaine POSITIVE (A) NONE DETECTED   Benzodiazepines NONE DETECTED NONE DETECTED   Amphetamines NONE DETECTED NONE DETECTED   Tetrahydrocannabinol NONE DETECTED NONE DETECTED   Barbiturates NONE DETECTED NONE DETECTED    Comment: (NOTE) DRUG SCREEN FOR MEDICAL PURPOSES ONLY.  IF CONFIRMATION IS NEEDED FOR ANY PURPOSE, NOTIFY LAB WITHIN 5 DAYS. LOWEST  DETECTABLE LIMITS FOR URINE DRUG SCREEN Drug Class                     Cutoff (ng/mL) Amphetamine and metabolites    1000 Barbiturate and metabolites    200 Benzodiazepine                 595 Tricyclics and metabolites     300 Opiates and metabolites        300 Cocaine and metabolites        300 THC                            50     Current Facility-Administered Medications  Medication Dose Route Frequency Provider Last Rate Last Dose  . acetaminophen (TYLENOL) tablet 650 mg  650 mg Oral Q4H PRN Charlesetta Shanks, MD   650 mg at 10/22/17 0536  . hydrOXYzine (ATARAX/VISTARIL) tablet 50 mg  50 mg Oral Q6H PRN Charlesetta Shanks, MD   50 mg at 10/21/17 2331  . ondansetron (ZOFRAN) tablet 4 mg  4 mg Oral Q8H PRN Charlesetta Shanks, MD       Current Outpatient Medications  Medication Sig Dispense Refill  . acetaminophen (TYLENOL) 500 MG tablet Take 1,000 mg by mouth every 6 (six) hours as needed for moderate pain.    Marland Kitchen gabapentin (NEURONTIN) 300 MG capsule Take 300 mg by mouth once.    Marland Kitchen ibuprofen (ADVIL,MOTRIN) 200 MG tablet  Take 400 mg by mouth every 6 (six) hours as needed for moderate pain.    . nortriptyline (PAMELOR) 25 MG capsule Take 25 mg by mouth at bedtime.    . cephALEXin (KEFLEX) 500 MG capsule Take 1 capsule (500 mg total) 3 (three) times daily by mouth. (Patient not taking: Reported on 10/21/2017) 21 capsule 0  . HYDROcodone-acetaminophen (NORCO/VICODIN) 5-325 MG tablet Take 1 tablet every 4 (four) hours as needed by mouth for moderate pain. (Patient not taking: Reported on 10/21/2017) 12 tablet 0  . promethazine (PHENERGAN) 25 MG suppository Place 1 suppository (25 mg total) every 6 (six) hours as needed rectally for nausea or vomiting. 12 each 0    Musculoskeletal: Strength & Muscle Tone: within normal limits Gait & Station: normal Patient leans: N/A  Psychiatric Specialty Exam: Physical Exam  Nursing note and vitals reviewed. Constitutional: She is oriented to person,  place, and time. She appears well-developed and well-nourished.  HENT:  Head: Normocephalic and atraumatic.  Neck: Normal range of motion.  Respiratory: Effort normal.  Musculoskeletal: Normal range of motion.  Neurological: She is alert and oriented to person, place, and time.  Psychiatric: Her speech is normal and behavior is normal. Judgment normal. Cognition and memory are normal. She exhibits a depressed mood.    Review of Systems  Psychiatric/Behavioral: Positive for depression, substance abuse and suicidal ideas.  All other systems reviewed and are negative.   Blood pressure 101/63, pulse (!) 54, temperature 97.9 F (36.6 C), temperature source Oral, resp. rate 18, SpO2 98 %.There is no height or weight on file to calculate BMI.  General Appearance: Casual  Eye Contact:  Good  Speech:  Normal Rate  Volume:  Increased  Mood:  Angry, Depressed and Irritable  Affect:  Congruent  Thought Process:  Coherent and Descriptions of Associations: Intact  Orientation:  Full (Time, Place, and Person)  Thought Content:  Rumination  Suicidal Thoughts:  Yes.  with intent/plan previously but currently denies.  Homicidal Thoughts:  No  Memory:  Immediate;   Fair Recent;   Fair Remote;   Fair  Judgement:  Poor  Insight:  Lacking  Psychomotor Activity:  Normal  Concentration:  Concentration: Fair and Attention Span: Fair  Recall:  [employer] of Knowledge:  Fair  Language:  Good  Akathisia:  No  Handed:  Right  AIMS (if indicated):   N/A  Assets:  Housing Leisure Time Physical Health Resilience Social Support  ADL's:  Intact  Cognition:  WNL  Sleep:   N/A     Treatment Plan Summary: Daily contact with patient to assess and evaluate symptoms and progress in treatment, Medication management and Plan major depressive disorder, single episode, severe without psychosis:  -Crisis stabilization -Medication management:  Started Gabapentin 300 mg TID for withdrawal symptoms and Celexa  10 mg daily for depression. -Individual and substance abuse counseling -Peer support counseling  Disposition: Recommend psychiatric Inpatient admission when medically cleared.  Waylan Boga, NP 10/22/2017 10:35 AM   Patient seen face-to-face for psychiatric evaluation, chart reviewed and case discussed with the physician extender and developed treatment plan. Reviewed the information documented and agree with the treatment plan.  Buford Dresser, DO

## 2017-10-22 NOTE — ED Notes (Signed)
Patient is irritable, agitated and yelling. Patient states "this stupid as hell of them to put me here. They said I was voluntary and now I'm involuntary so I'm just going to curse and go off. Dr. Donnald GarrePfeiffer contacted and informed of patient behavior new orders received for vistaril 50 mg po Q 6 hrs prn. Orders read back and verified.

## 2017-10-22 NOTE — Tx Team (Signed)
Initial Treatment Plan 10/22/2017 5:51 PM Sylvia BlalockGemanyca Bentley RUE:[id number]:[id number]    PATIENT STRESSORS: Other: Pt refused to answer any questions about her treatement   PATIENT STRENGTHS: General fund of knowledge Physical Health Other: Pt was unwilling to participate in admission process   PATIENT IDENTIFIED PROBLEMS: Suicidal ideation    Patient is refusing to answer any questions during admission                 DISCHARGE CRITERIA:  Improved stabilization in mood, thinking, and/or behavior Verbal commitment to aftercare and medication compliance  PRELIMINARY DISCHARGE PLAN: Outpatient therapy Medication management  PATIENT/FAMILY INVOLVEMENT: This treatment plan has been presented to and reviewed with the patient, Sylvia Mccann.  The patient and family have been given the opportunity to ask questions and make suggestions.  Levin BaconHeather V Haly Feher, RN 10/22/2017, 5:51 PM

## 2017-10-22 NOTE — ED Notes (Signed)
Nurse called back and [facility].

## 2017-10-22 NOTE — ED Notes (Signed)
Police called for transport. 

## 2017-10-22 NOTE — Progress Notes (Signed)
Psychoeducational Group Note  Date:  10/22/2017 Time:  2202  Group Topic/Focus:  Wrap-Up Group:   The focus of this group is to help patients review their daily goal of treatment and discuss progress on daily workbooks.  Participation Level: Did Not Attend  Participation Quality:  Not Applicable  Affect:  Not Applicable  Cognitive:  Not Applicable  Insight:  Not Applicable  Engagement in Group: Not Applicable  Additional Comments:  The patient refused to attend group and went to sleep after being given a dinner tray.   Hazle CocaGOODMAN, Jozelynn Danielson S 10/22/2017, 10:02 PM

## 2017-10-22 NOTE — ED Notes (Addendum)
Patient became agitated hitting windows and doors at nursing station.   Nurse practitioner ordered po medications initially, then after patient continued to escalate her behavior, IM medications were ordered.  Security trying to redirect patient back to her room.  Patient refusing, so security escorted her to room and injections were given in right thigh with security restraining patient.

## 2017-10-22 NOTE — [facility] Suicide Risk Assessment (Signed)
[facility]   Nursing information obtained from:  Patient Demographic factors:  Adolescent or young adult Current Mental Status:  NA(refusing to answer any questions) Loss Factors:  NA(refusing to answer any questions) Historical Factors:  NA(refusing to answer any questions) Risk Reduction Factors:  NA(refusing to answer any questions)  Total Time spent with patient: 45 minutes Principal Problem:  Overdose, Undetermined Intent  Diagnosis:   Patient Active Problem List   Diagnosis Date Noted  . Major depressive disorder, single episode, severe without psychotic features ([facility]) [F32.2] 10/22/2017  . Major depressive disorder, single episode, severe without psychosis ([facility]) [F32.2] 10/22/2017    Continued Clinical Symptoms:    The "Alcohol Use Disorders Identification Test", Guidelines for Use in Primary Care, Second Edition.  World Science writerHealth Organization [facility](WHO). Score between 0-7:  no or low risk or alcohol related problems. Score between 8-15:  moderate risk of alcohol related problems. Score between 16-19:  high risk of alcohol related problems. Score 20 or above:  warrants further diagnostic evaluation for alcohol dependence and treatment.   CLINICAL FACTORS:  26 year old single female, lives alone, employed. Admitted under commitment reporting patient has been depressed, making suicidal statements after recent break up and that she overdosed on Neurontin . Patient denies the above, states she took Neurontin ( not prescribed to her) in an attempt to alleviate headache, took two tablets, and denies any suicidal intent . Minimizes depression or neuro-vegetative symptoms, but  does present depressed, irritable, dysphoric at present .   Psychiatric Specialty Exam: Physical Exam  ROS  Blood pressure 134/61, pulse 65, temperature 97.8 F (36.6 C), temperature source Oral, resp. rate 18, height 5\' 5"  (1.651 m), weight 83.9 kg (185 lb).Body mass index is 30.79 kg/m.    see admit note MSE     COGNITIVE FEATURES THAT CONTRIBUTE TO RISK:  Closed-mindedness and Loss of executive function    SUICIDE RISK:   Moderate:  Frequent suicidal ideation with limited intensity, and duration, some specificity in terms of plans, no associated intent, good self-control, limited dysphoria/symptomatology, some risk factors present, and identifiable protective factors, including available and accessible social support.  PLAN OF CARE: Patient will be admitted to inpatient psychiatric unit for stabilization and safety. Will provide and encourage milieu participation. Provide medication management and maked adjustments as needed.  Will follow daily.    I certify that inpatient services furnished can reasonably be expected to improve the patient's condition.   Craige CottaFernando A Latrena Benegas, MD 10/22/2017, 6:06 PM

## 2017-10-22 NOTE — BH Assessment (Signed)
[facility]  Per Juanetta BeetsJacqueline Norman, DO, this pt requires psychiatric hospitalization.  Berneice Heinrichina Tate, RN, [facility]; they will be ready to receive pt at 14:00.  Pt presents under IVC, and [facility].  Pt's nurse, Aram BeechamCynthia, has been notified, and agrees to call report to [phone].  Pt is to be transported via [employer] examinerlaw enforcement.   Doylene Canninghomas Alfretta Pinch, MA Triage Specialist [phone]

## 2017-10-22 NOTE — BH Assessment (Signed)
[facility] Assessment Progress Note  At the request of Sylvia BeetsJacqueline Norman, DO, this writer called pt's [name], Sylvia Mccann ([phone]); pt had reportedly given Sylvia Mccann verbal permission to contact him.  Call was placed at 10:17.  [name] reports that pt lives alone following recent break-up with her fiance, but [name] has daily contact with pt.  Relationship with fiance has been distressed for some time, and three weeks ago the fiance called law enforcement on three occasions for wellness checks, but pt would not open the door for them.  Yesterday the fiance presented at pt's home to pick up his car, and reportedly found pt with another man, ending any chance of reconciliation.  [name] reports that pt has repeatedly been saying, "I'm going to kill myself," and he has told her that these statements are very serious.  Yesterday she accessed [name]'s gabapentin, ingesting several pills, resulting in [name] calling 911.  While she spit out some of them, emergency responders did a pill count, and could not account for seven pills.  [name] reports that pt does not receive outpatient behavioral health services.  He has tried to convince her to see a professional, but so far she has refused.  After staffing these details with Sylvia Mccann, it has been determined that pt requires psychiatric hospitalization at this time.  Pt presents under IVC initiated by EDP Arby BarretteMarcy Pfeiffer, MD.  This writer will seek placement for pt.  Sylvia Mccann, [place] Behavioral Health Coordinator [phone]

## 2017-10-22 NOTE — ED Notes (Signed)
Patient walked up to nursing station with her breakfast tray and said, "I'm not eating this crappy food."  Patient handed tray to nurse and nurse disposed of it.

## 2017-10-22 NOTE — Progress Notes (Signed)
Pt in bed with covers up to chin.  Pt is awake.  Pt only answers questions with a nod of her head and does not speak to ask any questions.   A: Introduced self to pt. Offered support and encouragement.   Q15 minute safety checks maintained. R: Pt is safe on the unit.  Pt nods head "yes" when asked if she will contract for safety. Will continue to monitor and assess

## 2017-10-22 NOTE — H&P (Addendum)
Psychiatric Admission Assessment Adult  Patient Identification: Sylvia Mccann MRN:  [id number] Date of Evaluation:  10/22/2017 Chief Complaint:  " I don't need to be here, this is messed up" Principal Diagnosis: Overdose, undetermined intent  Diagnosis:   Patient Active Problem List   Diagnosis Date Noted  . Major depressive disorder, single episode, severe without psychotic features ([place]) [F32.2] 10/22/2017  . Major depressive disorder, single episode, severe without psychosis ([place]) [F32.2] 10/22/2017   History of Present Illness: 26 year old female who presented to ED via ambulance after overdosing on Neurontin. As per ED notes, she overdosed on about seven 300 mgr Gabapentin tablets. Patient denies this, states she only took two tablets and that the intent was not suicidal but rather to address a headache. States Gabapentin not prescribed to her/prescribed to God father, and that she takes only occasionally when she felt she was getting a headache. ED notes indicate patient's God father reported patient has been making suicidal threats after recent break up with her boyfriend . She states this information is not accurate- acknowledges breaking up a few weeks ago, but states " I am not that upset about it " and denies having had any suicidal ideations. She states " I have been a little depressed, but that's normal after breaking up". At this time presents irritable, states " all I want to do is get out of here, I don't need to be here" She denies neuro-vegetative symptoms or anhedonia. Does endorse insomnia.  Of note, admission UDS is positive for cocaine- patient endorses isolated use x 1 prior to admission, denies pattern of abuse  Associated Signs/Symptoms: Depression Symptoms:  insomnia, (Hypo) Manic Symptoms:  Denies  Anxiety Symptoms: denies  Psychotic Symptoms:   Denies  PTSD Symptoms: Denies  Total Time spent with patient: 45 minutes   Past Psychiatric History: states she has  never attempted suicide in the past, denies history of self cutting, denies history of psychosis, denies history of severe depressive episodes in the past, denies history of mania or hypomania .  Denies agoraphobia or panic attacks. Denies history of violence .  Is the patient at risk to self? Yes.    Has the patient been a risk to self in the past 6 months? No.  Has the patient been a risk to self within the distant past? No.  Is the patient a risk to others? No.  Has the patient been a risk to others in the past 6 months? No.  Has the patient been a risk to others within the distant past? No.   Prior Inpatient Therapy:  denies prior psychiatric admissions Prior Outpatient Therapy:  denies - reports medications were being prescribed by her Neurologist   Alcohol Screening: Patient refused Alcohol Screening Tool: Yes Substance Abuse History in the last 12 months: denies alcohol or drug abuse  Consequences of Substance Abuse:Denies  Previous Psychotropic Medications: prescribed Nortriptyline for migraines .States she was not taking these medications, and that she took Neurontin ( not prescribed to her)  only occasionally when she had a headache. States she has never been on other psychiatric medications . Psychological Evaluations:  No  Past Medical History:  Past Medical History:  Diagnosis Date  . Asthma   . Migraine    History reviewed. No pertinent surgical history. Family History: father deceased, died when patient was very young , patient does not know cause. Mother alive, patient states they are not close. No siblings. Family Psychiatric  History: states mother and father did  have psychiatric issues, but that she does not know which. No suicides in family. Denies substance abuse or alcohol abuse in family Tobacco Screening: Have you used any form of tobacco in the last 30 days? (Cigarettes, Smokeless Tobacco, Cigars, and/or Pipes): Patient Refused Screening Social History: 26 year  old single female, no children, lives alone , unemployed. Social History   Substance and Sexual Activity  Alcohol Use No     Social History   Substance and Sexual Activity  Drug Use No    Additional Social History:  Allergies:   Allergies  Allergen Reactions  . Diclofenac Potassium Other (See Comments)    'MADE ME FEEL LIKE MY HEAD WAS ON FIRE"   Lab Results:  Results for orders placed or performed during the hospital encounter of 10/21/17 (from the past 48 hour(s))  Comprehensive metabolic panel     Status: Abnormal   Collection Time: 10/21/17  8:02 PM  Result Value Ref Range   Sodium 138 135 - 145 mmol/L   Potassium 3.5 3.5 - 5.1 mmol/L   Chloride 101 101 - 111 mmol/L   CO2 26 22 - 32 mmol/L   Glucose, Bld 88 65 - 99 mg/dL   BUN 14 6 - 20 mg/dL   Creatinine, Ser 1.07 (H) 0.44 - 1.00 mg/dL   Calcium 10.1 8.9 - 10.3 mg/dL   Total Protein 9.9 (H) 6.5 - 8.1 g/dL   Albumin 5.3 (H) 3.5 - 5.0 g/dL   AST 17 15 - 41 U/L   ALT 12 (L) 14 - 54 U/L   Alkaline Phosphatase 82 38 - 126 U/L   Total Bilirubin 0.8 0.3 - 1.2 mg/dL   GFR calc non Af Amer >60 >60 mL/min   GFR calc Af Amer >60 >60 mL/min    Comment: (NOTE) The eGFR has been calculated using the CKD EPI equation. This calculation has not been validated in all clinical situations. eGFR's persistently <60 mL/min signify possible Chronic Kidney Disease.    Anion gap 11 5 - 15  Acetaminophen level     Status: Abnormal   Collection Time: 10/21/17  8:02 PM  Result Value Ref Range   Acetaminophen (Tylenol), Serum <10 (L) 10 - 30 ug/mL    Comment:        THERAPEUTIC CONCENTRATIONS VARY SIGNIFICANTLY. A RANGE OF 10-30 ug/mL MAY BE AN EFFECTIVE CONCENTRATION FOR MANY PATIENTS. HOWEVER, SOME ARE BEST TREATED AT CONCENTRATIONS OUTSIDE THIS RANGE. ACETAMINOPHEN CONCENTRATIONS >150 ug/mL AT 4 HOURS AFTER INGESTION AND >50 ug/mL AT 12 HOURS AFTER INGESTION ARE OFTEN ASSOCIATED WITH TOXIC REACTIONS.   Ethanol     Status:  None   Collection Time: 10/21/17  8:02 PM  Result Value Ref Range   Alcohol, Ethyl (B) <10 <10 mg/dL    Comment:        LOWEST DETECTABLE LIMIT FOR SERUM ALCOHOL IS 10 mg/dL FOR MEDICAL PURPOSES ONLY   CBC with Differential     Status: Abnormal   Collection Time: 10/21/17  8:02 PM  Result Value Ref Range   WBC 8.9 4.0 - 10.5 K/uL   RBC 5.18 (H) 3.87 - 5.11 MIL/uL   Hemoglobin 14.5 12.0 - 15.0 g/dL   HCT 44.5 36.0 - 46.0 %   MCV 85.9 78.0 - 100.0 fL   MCH 28.0 26.0 - 34.0 pg   MCHC 32.6 30.0 - 36.0 g/dL   RDW 13.3 11.5 - 15.5 %   Platelets 296 150 - 400 K/uL   Neutrophils Relative % 75 %  Neutro Abs 6.7 1.7 - 7.7 K/uL   Lymphocytes Relative 18 %   Lymphs Abs 1.6 0.7 - 4.0 K/uL   Monocytes Relative 5 %   Monocytes Absolute 0.4 0.1 - 1.0 K/uL   Eosinophils Relative 2 %   Eosinophils Absolute 0.2 0.0 - 0.7 K/uL   Basophils Relative 0 %   Basophils Absolute 0.0 0.0 - 0.1 K/uL  I-Stat beta hCG blood, ED     Status: None   Collection Time: 10/21/17  8:28 PM  Result Value Ref Range   I-stat hCG, quantitative <5.0 <5 mIU/mL   Comment 3            Comment:   GEST. AGE      CONC.  (mIU/mL)   <=1 WEEK        5 - 50     2 WEEKS       50 - 500     3 WEEKS       100 - 10,000     4 WEEKS     1,000 - 30,000        FEMALE AND NON-PREGNANT FEMALE:     LESS THAN 5 mIU/mL   Urinalysis, Routine w reflex microscopic     Status: Abnormal   Collection Time: 10/21/17  8:35 PM  Result Value Ref Range   Color, Urine YELLOW YELLOW   APPearance CLEAR CLEAR   Specific Gravity, Urine 1.032 (H) 1.005 - 1.030   pH 5.0 5.0 - 8.0   Glucose, UA NEGATIVE NEGATIVE mg/dL   Hgb urine dipstick LARGE (A) NEGATIVE   Bilirubin Urine NEGATIVE NEGATIVE   Ketones, ur 20 (A) NEGATIVE mg/dL   Protein, ur 100 (A) NEGATIVE mg/dL   Nitrite NEGATIVE NEGATIVE   Leukocytes, UA SMALL (A) NEGATIVE   RBC / HPF 0-5 0 - 5 RBC/hpf   WBC, UA 0-5 0 - 5 WBC/hpf   Bacteria, UA NONE SEEN NONE SEEN   Squamous Epithelial /  LPF 0-5 (A) NONE SEEN   Mucus PRESENT   Urine rapid drug screen (hosp performed)     Status: Abnormal   Collection Time: 10/21/17  8:35 PM  Result Value Ref Range   Opiates NONE DETECTED NONE DETECTED   Cocaine POSITIVE (A) NONE DETECTED   Benzodiazepines NONE DETECTED NONE DETECTED   Amphetamines NONE DETECTED NONE DETECTED   Tetrahydrocannabinol NONE DETECTED NONE DETECTED   Barbiturates NONE DETECTED NONE DETECTED    Comment: (NOTE) DRUG SCREEN FOR MEDICAL PURPOSES ONLY.  IF CONFIRMATION IS NEEDED FOR ANY PURPOSE, NOTIFY LAB WITHIN 5 DAYS. LOWEST DETECTABLE LIMITS FOR URINE DRUG SCREEN Drug Class                     Cutoff (ng/mL) Amphetamine and metabolites    1000 Barbiturate and metabolites    200 Benzodiazepine                 381 Tricyclics and metabolites     300 Opiates and metabolites        300 Cocaine and metabolites        300 THC                            50     Blood Alcohol level:  Lab Results  Component Value Date   ETH <10 82/99/3716    Metabolic Disorder Labs:  No results found for: HGBA1C, MPG No results found for:  PROLACTIN No results found for: CHOL, TRIG, HDL, CHOLHDL, VLDL, LDLCALC  Current Medications: Current Facility-Administered Medications  Medication Dose Route Frequency Provider Last Rate Last Dose  . acetaminophen (TYLENOL) tablet 650 mg  650 mg Oral Q4H PRN Patrecia Pour, NP      . alum & mag hydroxide-simeth (MAALOX/MYLANTA) 200-200-20 MG/5ML suspension 30 mL  30 mL Oral Q4H PRN Patrecia Pour, NP      . Derrill Memo ON 10/23/2017] citalopram (CELEXA) tablet 10 mg  10 mg Oral Daily Patrecia Pour, NP      . gabapentin (NEURONTIN) capsule 300 mg  300 mg Oral TID Patrecia Pour, NP      . hydrOXYzine (ATARAX/VISTARIL) tablet 50 mg  50 mg Oral Q6H PRN Patrecia Pour, NP      . magnesium hydroxide (MILK OF MAGNESIA) suspension 30 mL  30 mL Oral Daily PRN Patrecia Pour, NP      . ondansetron [facility]) tablet 4 mg  4 mg Oral Q8H PRN  Patrecia Pour, NP      . traZODone (DESYREL) tablet 50 mg  50 mg Oral QHS PRN Patrecia Pour, NP       PTA Medications: Medications Prior to Admission  Medication Sig Dispense Refill Last Dose  . acetaminophen (TYLENOL) 500 MG tablet Take 1,000 mg by mouth every 6 (six) hours as needed for moderate pain.   10/21/2017 at Unknown time  . cephALEXin (KEFLEX) 500 MG capsule Take 1 capsule (500 mg total) 3 (three) times daily by mouth. (Patient not taking: Reported on 10/21/2017) 21 capsule 0 Completed Course at Unknown time  . gabapentin (NEURONTIN) 300 MG capsule Take 300 mg by mouth once.   10/21/2017 at Unknown time  . HYDROcodone-acetaminophen (NORCO/VICODIN) 5-325 MG tablet Take 1 tablet every 4 (four) hours as needed by mouth for moderate pain. (Patient not taking: Reported on 10/21/2017) 12 tablet 0 Completed Course at Unknown time  . ibuprofen (ADVIL,MOTRIN) 200 MG tablet Take 400 mg by mouth every 6 (six) hours as needed for moderate pain.   10/21/2017 at Unknown time  . nortriptyline (PAMELOR) 25 MG capsule Take 25 mg by mouth at bedtime.   Past Month at Unknown time  . promethazine (PHENERGAN) 25 MG suppository Place 1 suppository (25 mg total) every 6 (six) hours as needed rectally for nausea or vomiting. 12 each 0 unknown    Musculoskeletal: Strength & Muscle Tone: within normal limits Gait & Station: normal Patient leans: N/A  Psychiatric Specialty Exam: Physical Exam  Review of Systems  Constitutional: Negative.   HENT: Negative.   Eyes: Negative.   Respiratory: Negative.   Cardiovascular: Negative.   Gastrointestinal: Negative.   Genitourinary: Negative.        Reports menstrual cramping   Musculoskeletal: Negative.   Skin: Negative.   Neurological: Positive for headaches.       History of migraines   Endo/Heme/Allergies: Negative.   Psychiatric/Behavioral: Positive for depression.  All other systems reviewed and are negative.   Blood pressure 134/61, pulse 65,  temperature 97.8 F (36.6 C), temperature source Oral, resp. rate 18, height 5' 5"  (1.651 m), weight 83.9 kg (185 lb).Body mass index is 30.79 kg/m.  General Appearance: Fairly Groomed  Eye Contact:  Minimal  Speech:  Normal Rate  Volume:  variable   Mood:  Dysphoric and Irritable  Affect:  presents restricted and irritable, which she states is due to frustration she feels about being in hospital  Thought Process:  Linear and Descriptions of Associations: Intact  Orientation:  Other:  fully alert and attentive   Thought Content:  denies hallucinations, no delusions, not internally preoccupied   Suicidal Thoughts:  No denies any suicidal or self injurious ideations, denies any homicidal ideations  Homicidal Thoughts:  No  Memory:  recent and remote grossly intact   Judgement:  Other:  fair  Insight:  Fair  Psychomotor Activity:  Normal  Concentration:  Concentration: Good and Attention Span: Good  Recall:  Good  Fund of Knowledge:  Good  Language:  Good  Akathisia:  No  Handed:  Right  AIMS (if indicated):     Assets:  Communication Skills Desire for Improvement Resilience  ADL's:  Intact  Cognition:  WNL  Sleep:       Treatment Plan Summary: Daily contact with patient to assess and evaluate symptoms and progress in treatment, Medication management, Plan inpatient treatment  and medications as below  Observation Level/Precautions:  15 minute checks  Laboratory:  as needed   Psychotherapy:  [place], groups   Medications:  Patient states she was not being prescribed Gabapentin . D/C Neurontin Patient was started on Celexa. States she does not want to take any standing medications at this time- will discontinue  D/C Celexa  Vistaril and Trazodone PRNs   Consultations: as needed    Discharge Concerns:  -  Estimated LOS: 3-4 days   Other:  At this time patient declining consent to speak with God father or other family     Physician Treatment Plan for Primary Diagnosis:   Overdose, Undetermined Intent  Long Term Goal(s): Improvement in symptoms so as ready for discharge  Short Term Goals: Ability to identify changes in lifestyle to reduce recurrence of condition will improve, Ability to verbalize feelings will improve, Ability to disclose and discuss suicidal ideas, Ability to demonstrate self-control will improve, Ability to identify and develop effective coping behaviors will improve and Ability to maintain clinical measurements within normal limits will improve  Physician Treatment Plan for Secondary Diagnosis: Depression- consider Adjustment Disorder with Depressed Mood   Long Term Goal(s): Improvement in symptoms so as ready for discharge  Short Term Goals: Ability to identify changes in lifestyle to reduce recurrence of condition will improve, Ability to identify and develop effective coping behaviors will improve and Ability to maintain clinical measurements within normal limits will improve  I certify that inpatient services furnished can reasonably be expected to improve the patient's condition.    Jenne Campus, MD 12/28/20185:29 PM

## 2017-10-22 NOTE — ED Notes (Signed)
[facility]  Placed on hold and no one answered.

## 2017-10-23 DIAGNOSIS — R450 Nervousness: Secondary | ICD-10-CM

## 2017-10-23 DIAGNOSIS — F419 Anxiety disorder, unspecified: Secondary | ICD-10-CM

## 2017-10-23 DIAGNOSIS — F332 Major depressive disorder, recurrent severe without psychotic features: Secondary | ICD-10-CM

## 2017-10-23 MED ORDER — TRAZODONE HCL 100 MG PO TABS
100.0000 mg | ORAL_TABLET | Freq: Every evening | ORAL | Status: DC | PRN
Start: 2017-10-23 — End: 2017-10-24
  Administered 2017-10-23: 100 mg via ORAL
  Filled 2017-10-23: qty 1

## 2017-10-23 NOTE — Progress Notes (Signed)
Nursing Progress Note: 7-7p  D- Mood is irritable and angry. " I'm ready to go, I don't need to be here. I already talked with the other lady, I don't need to talk with you ". Affect is labile. Pt is able to contract for safety. Pt signed her 72 hour release @ 1028 today Tamika NP notified. Pt hasn't been attending groups has been either on phone or standing in hall across from [employer].  A - No interaction with peers noted.Support and encouragement offered, safety maintained with q 15 minutes.Refused 1:1, pt did take a shower and accepted snacks from this RN.  R-Contracts for safety. Pt is waiting for Dr Jama Flavorsobos..Marland Kitchen

## 2017-10-23 NOTE — Progress Notes (Signed)
[facility] MD Progress Note  10/23/2017 4:10 PM Sylvia Mccann  MRN:  [id number]   Subjective:  Sylvia Mccann reports " I am ready to go, I don't want to talk about it, I just want to leave."  Objective: Sylvia Mccann is awake, alert and oriented. Seen standing at the nursing station. Patient present with irritability and agitation. Patient reports " I only took two pills, I wasn't trying to kill myself." Patient is guarded with poor insight. Patient wouldn't provided authorization to get collateral information. Sylvia Mccann states " I just want to leave all I do is sleep all day." Discussed changing to  voluntary statue with MD and Patient. 72 hours request to discharge.  Denies suicidal or homicidal ideation. Denies auditory or visual hallucination and does not appear to be responding to internal stimuli. Support, encouragement and reassurance was provided.   Principal Problem: Major depressive disorder, single episode, severe without psychosis ([place]) Diagnosis:   Patient Active Problem List   Diagnosis Date Noted  . Major depressive disorder, single episode, severe without psychotic features ([place]) [F32.2] 10/22/2017  . Major depressive disorder, single episode, severe without psychosis ([place]) [F32.2] 10/22/2017   Total Time spent with patient: 30 minutes  Past Psychiatric History:   Past Medical History:  Past Medical History:  Diagnosis Date  . Asthma   . Migraine    History reviewed. No pertinent surgical history. Family History: History reviewed. No pertinent family history. Family Psychiatric  History:  Social History:  Social History   Substance and Sexual Activity  Alcohol Use No     Social History   Substance and Sexual Activity  Drug Use No    Social History   Socioeconomic History  . Marital status: Single    Spouse name: None  . Number of children: None  . Years of education: None  . Highest education level: None  Social Needs  . Financial resource strain: None  . Food  insecurity - worry: None  . Food insecurity - inability: None  . Transportation needs - medical: None  . Transportation needs - non-medical: None  Occupational History  . None  Tobacco Use  . Smoking status: Current Every Day Smoker    Packs/day: 0.50    Types: Cigarettes  . Smokeless tobacco: Never Used  Substance and Sexual Activity  . Alcohol use: No  . Drug use: No  . Sexual activity: Yes    Birth control/protection: None  Other Topics Concern  . None  Social History Narrative  . None   Additional Social History:                         Sleep: Fair  Appetite:  Fair  Current Medications: Current Facility-Administered Medications  Medication Dose Route Frequency Provider Last Rate Last Dose  . acetaminophen (TYLENOL) tablet 650 mg  650 mg Oral Q4H PRN Patrecia Pour, NP   650 mg at 10/23/17 0142  . alum & mag hydroxide-simeth (MAALOX/MYLANTA) 200-200-20 MG/5ML suspension 30 mL  30 mL Oral Q4H PRN Patrecia Pour, NP      . hydrOXYzine (ATARAX/VISTARIL) tablet 50 mg  50 mg Oral Q6H PRN Patrecia Pour, NP   50 mg at 10/23/17 0142  . magnesium hydroxide (MILK OF MAGNESIA) suspension 30 mL  30 mL Oral Daily PRN Patrecia Pour, NP      . ondansetron [facility]) tablet 4 mg  4 mg Oral Q8H PRN Patrecia Pour, NP      .  traZODone (DESYREL) tablet 50 mg  50 mg Oral QHS PRN Patrecia Pour, NP        Lab Results:  Results for orders placed or performed during the hospital encounter of 10/21/17 (from the past 48 hour(s))  Comprehensive metabolic panel     Status: Abnormal   Collection Time: 10/21/17  8:02 PM  Result Value Ref Range   Sodium 138 135 - 145 mmol/L   Potassium 3.5 3.5 - 5.1 mmol/L   Chloride 101 101 - 111 mmol/L   CO2 26 22 - 32 mmol/L   Glucose, Bld 88 65 - 99 mg/dL   BUN 14 6 - 20 mg/dL   Creatinine, Ser 1.07 (H) 0.44 - 1.00 mg/dL   Calcium 10.1 8.9 - 10.3 mg/dL   Total Protein 9.9 (H) 6.5 - 8.1 g/dL   Albumin 5.3 (H) 3.5 - 5.0 g/dL   AST 17 15 -  41 U/L   ALT 12 (L) 14 - 54 U/L   Alkaline Phosphatase 82 38 - 126 U/L   Total Bilirubin 0.8 0.3 - 1.2 mg/dL   GFR calc non Af Amer >60 >60 mL/min   GFR calc Af Amer >60 >60 mL/min    Comment: (NOTE) The eGFR has been calculated using the CKD EPI equation. This calculation has not been validated in all clinical situations. eGFR's persistently <60 mL/min signify possible Chronic Kidney Disease.    Anion gap 11 5 - 15  Acetaminophen level     Status: Abnormal   Collection Time: 10/21/17  8:02 PM  Result Value Ref Range   Acetaminophen (Tylenol), Serum <10 (L) 10 - 30 ug/mL    Comment:        THERAPEUTIC CONCENTRATIONS VARY SIGNIFICANTLY. A RANGE OF 10-30 ug/mL MAY BE AN EFFECTIVE CONCENTRATION FOR MANY PATIENTS. HOWEVER, SOME ARE BEST TREATED AT CONCENTRATIONS OUTSIDE THIS RANGE. ACETAMINOPHEN CONCENTRATIONS >150 ug/mL AT 4 HOURS AFTER INGESTION AND >50 ug/mL AT 12 HOURS AFTER INGESTION ARE OFTEN ASSOCIATED WITH TOXIC REACTIONS.   Ethanol     Status: None   Collection Time: 10/21/17  8:02 PM  Result Value Ref Range   Alcohol, Ethyl (B) <10 <10 mg/dL    Comment:        LOWEST DETECTABLE LIMIT FOR SERUM ALCOHOL IS 10 mg/dL FOR MEDICAL PURPOSES ONLY   CBC with Differential     Status: Abnormal   Collection Time: 10/21/17  8:02 PM  Result Value Ref Range   WBC 8.9 4.0 - 10.5 K/uL   RBC 5.18 (H) 3.87 - 5.11 MIL/uL   Hemoglobin 14.5 12.0 - 15.0 g/dL   HCT 44.5 36.0 - 46.0 %   MCV 85.9 78.0 - 100.0 fL   MCH 28.0 26.0 - 34.0 pg   MCHC 32.6 30.0 - 36.0 g/dL   RDW 13.3 11.5 - 15.5 %   Platelets 296 150 - 400 K/uL   Neutrophils Relative % 75 %   Neutro Abs 6.7 1.7 - 7.7 K/uL   Lymphocytes Relative 18 %   Lymphs Abs 1.6 0.7 - 4.0 K/uL   Monocytes Relative 5 %   Monocytes Absolute 0.4 0.1 - 1.0 K/uL   Eosinophils Relative 2 %   Eosinophils Absolute 0.2 0.0 - 0.7 K/uL   Basophils Relative 0 %   Basophils Absolute 0.0 0.0 - 0.1 K/uL  I-Stat beta hCG blood, ED     Status:  None   Collection Time: 10/21/17  8:28 PM  Result Value Ref Range   I-stat hCG,  quantitative <5.0 <5 mIU/mL   Comment 3            Comment:   GEST. AGE      CONC.  (mIU/mL)   <=1 WEEK        5 - 50     2 WEEKS       50 - 500     3 WEEKS       100 - 10,000     4 WEEKS     1,000 - 30,000        FEMALE AND NON-PREGNANT FEMALE:     LESS THAN 5 mIU/mL   Urinalysis, Routine w reflex microscopic     Status: Abnormal   Collection Time: 10/21/17  8:35 PM  Result Value Ref Range   Color, Urine YELLOW YELLOW   APPearance CLEAR CLEAR   Specific Gravity, Urine 1.032 (H) 1.005 - 1.030   pH 5.0 5.0 - 8.0   Glucose, UA NEGATIVE NEGATIVE mg/dL   Hgb urine dipstick LARGE (A) NEGATIVE   Bilirubin Urine NEGATIVE NEGATIVE   Ketones, ur 20 (A) NEGATIVE mg/dL   Protein, ur 100 (A) NEGATIVE mg/dL   Nitrite NEGATIVE NEGATIVE   Leukocytes, UA SMALL (A) NEGATIVE   RBC / HPF 0-5 0 - 5 RBC/hpf   WBC, UA 0-5 0 - 5 WBC/hpf   Bacteria, UA NONE SEEN NONE SEEN   Squamous Epithelial / LPF 0-5 (A) NONE SEEN   Mucus PRESENT   Urine rapid drug screen (hosp performed)     Status: Abnormal   Collection Time: 10/21/17  8:35 PM  Result Value Ref Range   Opiates NONE DETECTED NONE DETECTED   Cocaine POSITIVE (A) NONE DETECTED   Benzodiazepines NONE DETECTED NONE DETECTED   Amphetamines NONE DETECTED NONE DETECTED   Tetrahydrocannabinol NONE DETECTED NONE DETECTED   Barbiturates NONE DETECTED NONE DETECTED    Comment: (NOTE) DRUG SCREEN FOR MEDICAL PURPOSES ONLY.  IF CONFIRMATION IS NEEDED FOR ANY PURPOSE, NOTIFY LAB WITHIN 5 DAYS. LOWEST DETECTABLE LIMITS FOR URINE DRUG SCREEN Drug Class                     Cutoff (ng/mL) Amphetamine and metabolites    1000 Barbiturate and metabolites    200 Benzodiazepine                 629 Tricyclics and metabolites     300 Opiates and metabolites        300 Cocaine and metabolites        300 THC                            50     Blood Alcohol level:  Lab Results   Component Value Date   ETH <10 52/84/1324    Metabolic Disorder Labs: No results found for: HGBA1C, MPG No results found for: PROLACTIN No results found for: CHOL, TRIG, HDL, CHOLHDL, VLDL, LDLCALC  Physical Findings: AIMS: Facial and Oral Movements Muscles of Facial Expression: None, normal Lips and Perioral Area: None, normal Jaw: None, normal Tongue: None, normal,Extremity Movements Upper (arms, wrists, hands, fingers): None, normal Lower (legs, knees, ankles, toes): None, normal, Trunk Movements Neck, shoulders, hips: None, normal, Overall Severity Severity of abnormal movements (highest score from questions above): None, normal Incapacitation due to abnormal movements: None, normal Patient's awareness of abnormal movements (rate only patient's report): No Awareness, Dental Status Current problems with teeth and/or  dentures?: No Does patient usually wear dentures?: No  CIWA:    COWS:     Musculoskeletal: Strength & Muscle Tone: within normal limits Gait & Station: normal Patient leans: N/A  Psychiatric Specialty Exam: Physical Exam  Vitals reviewed. Constitutional: She appears well-developed.  HENT:  Head: Normocephalic.  Cardiovascular: Normal rate.  Neurological: She is alert.  Psychiatric: She has a normal mood and affect. Her behavior is normal.    Review of Systems  Psychiatric/Behavioral: Positive for depression. The patient is nervous/anxious.     Blood pressure 108/66, pulse 63, temperature 98 F (36.7 C), temperature source Oral, resp. rate 16, height _0  (1.651 m), weight 83.9 kg (185 lb).Body mass index is 30.79 kg/m.  General Appearance: Casual  Eye Contact:  Fair  Speech:  Clear and Coherent  Volume:  Increased  Mood:  Anxious, Depressed and Irritable  Affect:  Depressed  Thought Process:  Coherent  Orientation:  Full (Time, Place, and Person)  Thought Content:  Hallucinations: None  Suicidal Thoughts:  No  Homicidal Thoughts:  No   Memory:  Immediate;   Fair Recent;   Fair  Judgement:  Poor  Insight:  Lacking  Psychomotor Activity:  Restlessness  Concentration:  Concentration: Poor  Recall:  Poor  Fund of Knowledge:  Poor  Language:  Fair  Akathisia:  No  Handed:  Right  AIMS (if indicated):     Assets:  Communication Skills Desire for Improvement Social Support  ADL's:  Intact  Cognition:  WNL  Sleep:  Number of Hours: 6     Treatment Plan Summary: Daily contact with patient to assess and evaluate symptoms and progress in treatment and Medication management    Continue with Trazodone 50 mg for insomnia  Will continue to monitor vitals ,medication compliance and treatment side effects while patient is here.  Reviewed labs:,BAL - UDS - pos for cocaine CSW will start working on disposition.  Patient to participate in therapeutic milieu  Derrill Center, NP 10/23/2017, 4:10 PM   Agree with NP Progress Note

## 2017-10-23 NOTE — [facility] Suicide Risk Assessment (Signed)
[facility] INPATIENT:  Family/Significant Other Suicide Prevention Education  Suicide Prevention Education:  Patient Refusal for Family/Significant Other Suicide Prevention Education: The patient Sylvia Mccann has refused to provide written consent for family/significant other to be provided Family/Significant Other Suicide Prevention Education during admission and/or prior to discharge.  Physician notified.  Beverly Sessionsywan J Dontarious Schaum 10/23/2017, 12:45 PM

## 2017-10-23 NOTE — Progress Notes (Signed)
D: Pt denies SI/HI/AVH. Pt is pleasant and cooperative. Pt appears to have insight into her Tx. Pt stated she was having relationship issues. Pt endorsed wanting to leave, pt was very receptive to information given by Clinical research associatewriter. " you the only one that got a smile out of me".   A: Pt was offered support and encouragement. Pt was given scheduled medications. Pt was encourage to attend groups. Q 15 minute checks were done for safety.   R:Pt attends groups and interacts well with peers and staff. Pt is taking medication. Pt has no complaints.Pt receptive to treatment and safety maintained on unit.

## 2017-10-23 NOTE — Plan of Care (Signed)
  Medication: Compliance with prescribed medication regimen will improve 10/23/2017 2159 - Progressing by Delos HaringPhillips, Deandra Gadson A, RN Note Pt taking medications this evening   Safety: Ability to demonstrate self-control will improve 10/23/2017 2159 - Progressing by Delos HaringPhillips, Moses Odoherty A, RN Note Pt been safe on the unit at this time
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
## 2017-10-24 LAB — HEMOGLOBIN A1C
HEMOGLOBIN A1C: 4.6 % — AB (ref 4.8–5.6)
MEAN PLASMA GLUCOSE: 85.32 mg/dL

## 2017-10-24 NOTE — Progress Notes (Signed)
Patient verbalizes readiness for discharge. Follow up plan explained, AVS, transition record and SRA given. No prescriptions provided.  All belongings returned. Suicide Safety Plan refused as patient states, "I'm not suicidal and never was." Patient verbalizes understanding. Denies SI/HI and assures this Clinical research associatewriter she will seek assistance should that change. Patient discharged ambulatory and in stable condition with 2 bus passes and directions to bus stop.

## 2017-10-24 NOTE — Discharge Summary (Signed)
Physician Discharge Summary Note  Patient:  Sylvia Mccann is an 26 y.o., female MRN:  [id number] DOB:  07/17/1991 Patient phone:  [phone] (home)  Patient address:   [address] [place] [id number],  Total Time spent with patient: 30 minutes  Date of Admission:  10/22/2017 Date of Discharge:  10/24/2017  Reason for Admission:Per assessment note- 26 year old female who presented to ED via ambulance after overdosing on Neurontin. As per ED notes, she overdosed on about seven 300 mgr Gabapentin tablets. Patient denies this, states she only took two tablets and that the intent was not suicidal but rather to address a headache. States Gabapentin not prescribed to her/prescribed to God father, and that she takes only occasionally when she felt she was getting a headache. ED notes indicate patient's God father reported patient has been making suicidal threats after recent break up with her boyfriend . She states this information is not accurate- acknowledges breaking up a few weeks ago, but states " I am not that upset about it " and denies having had any suicidal ideations. She states " I have been a little depressed, but that's normal after breaking up". At this time presents irritable, states " all I want to do is get out of here, I don't need to be here" She denies neuro-vegetative symptoms or anhedonia. Does endorse insomnia.  Of note, admission UDS is positive for cocaine- patient endorses isolated use x 1 prior to admission, denies pattern of abuse  Principal Problem: Major depressive disorder, single episode, severe without psychosis [facility](HCC) Discharge Diagnoses: Patient Active Problem List   Diagnosis Date Noted  . Major depressive disorder, single episode, severe without psychotic features (HCC) [F32.2] 10/22/2017  . Major depressive disorder, single episode, severe without psychosis (HCC) [F32.2] 10/22/2017    Past Psychiatric History:   Past Medical History:  Past Medical  History:  Diagnosis Date  . Asthma   . Migraine    History reviewed. No pertinent surgical history. Family History: History reviewed. No pertinent family history. Family Psychiatric  History:  Social History:  Social History   Substance and Sexual Activity  Alcohol Use No     Social History   Substance and Sexual Activity  Drug Use No    Social History   Socioeconomic History  . Marital status: Single    Spouse name: None  . Number of children: None  . Years of education: None  . Highest education level: None  Social Needs  . Financial resource strain: None  . Food insecurity - worry: None  . Food insecurity - inability: None  . Transportation needs - medical: None  . Transportation needs - non-medical: None  Occupational History  . None  Tobacco Use  . Smoking status: Current Every Day Smoker    Packs/day: 0.50    Types: Cigarettes  . Smokeless tobacco: Never Used  Substance and Sexual Activity  . Alcohol use: No  . Drug use: No  . Sexual activity: Yes    Birth control/protection: None  Other Topics Concern  . None  Social History Narrative  . None    Hospital Course:  Sylvia Mccann was admitted for Major depressive disorder, single episode, severe without psychosis (HCC)  and crisis management.  Pt was treated discharged with the medications listed below under Medication List.  Medical problems were identified and treated as needed.  Home medications were restarted as appropriate.  Improvement was monitored by observation and Sylvia Mccann 's daily report of symptom reduction.  Emotional and mental status was monitored by daily self-inventory reports completed by Sylvia Mccann and clinical staff.         Sylvia Mccann was evaluated by the treatment team for stability and plans for continued recovery upon discharge. Sylvia Mccann 's motivation was an integral factor for scheduling further treatment. Employment, transportation, bed availability, health status,  family support, and any pending legal issues were also considered during hospital stay. Pt was offered further treatment options upon discharge including but not limited to Residential, Intensive Outpatient, and Outpatient treatment.  Sylvia Mccann will follow up with the services as listed below under Follow Up Information.     Upon completion of this admission the patient was both mentally and medically stable for discharge denying suicidal/homicidal ideation, auditory/visual/tactile hallucinations, delusional thoughts and paranoia.  Patient continues to denie suicidal attempts denies that she is a danger to her self or others. Patient was monitor for safety.    Sylvia Mccann responded well to treatment and group sessions. Pt demonstrated improvement without reported or observed adverse effects to the point of stability appropriate for outpatient management.  Pertinent labs include: CMP and CBC, for which outpatient follow-up is necessary for lab recheck as mentioned below. Reviewed CBC, CMP, BAL, and UDS; all unremarkable aside from noted exceptions.   Physical Findings: AIMS: Facial and Oral Movements Muscles of Facial Expression: None, normal Lips and Perioral Area: None, normal Jaw: None, normal Tongue: None, normal,Extremity Movements Upper (arms, wrists, hands, fingers): None, normal Lower (legs, knees, ankles, toes): None, normal, Trunk Movements Neck, shoulders, hips: None, normal, Overall Severity Severity of abnormal movements (highest score from questions above): None, normal Incapacitation due to abnormal movements: None, normal Patient's awareness of abnormal movements (rate only patient's report): No Awareness, Dental Status Current problems with teeth and/or dentures?: No Does patient usually wear dentures?: No  CIWA:    COWS:     Musculoskeletal: Strength & Muscle Tone: within normal limits Gait & Station: normal Patient leans: N/A  Psychiatric Specialty Exam: See SRA  by MD Physical Exam  Vitals reviewed. Constitutional: She is oriented to person, place, and time. She appears well-developed.  Neurological: She is alert and oriented to person, place, and time.  Psychiatric: She has a normal mood and affect. Her behavior is normal.    Review of Systems  Psychiatric/Behavioral: Negative for depression (stable) and suicidal ideas. The patient is not nervous/anxious.     Blood pressure 100/67, pulse 94, temperature 97.7 F (36.5 C), temperature source Oral, resp. rate 16, height 5\' 5"  (1.651 m), weight 83.9 kg (185 lb).Body mass index is 30.79 kg/m.    Have you used any form of tobacco in the last 30 days? (Cigarettes, Smokeless Tobacco, Cigars, and/or Pipes): Patient Refused Screening  Has this patient used any form of tobacco in the last 30 days? (Cigarettes, Smokeless Tobacco, Cigars, and/or Pipes)  No  Blood Alcohol level:  Lab Results  Component Value Date   ETH <10 10/21/2017    Metabolic Disorder Labs:  Lab Results  Component Value Date   HGBA1C 4.6 (L) 10/23/2017   MPG 85.32 10/23/2017   No results found for: PROLACTIN No results found for: CHOL, TRIG, HDL, CHOLHDL, VLDL, LDLCALC  See Psychiatric Specialty Exam and Suicide Risk Assessment completed by Attending Physician prior to discharge.  Discharge destination:  Home  Is patient on multiple antipsychotic therapies at discharge:  Yes,   Do you recommend tapering to monotherapy for antipsychotics?  No   Has Patient had three  or more failed trials of antipsychotic monotherapy by history:  No  Recommended Plan for Multiple Antipsychotic Therapies: NA  Discharge Instructions    Diet - low sodium heart healthy   Complete by:  As directed    Diet - low sodium heart healthy   Complete by:  As directed    Discharge instructions   Complete by:  As directed    Take all medications as prescribed. Keep all follow-up appointments as scheduled.  Do not consume alcohol or use illegal  drugs while on prescription medications. Report any adverse effects from your medications to your primary care provider promptly.  In the event of recurrent symptoms or worsening symptoms, call 911, a crisis hotline, or go to the nearest emergency department for evaluation.   Increase activity slowly   Complete by:  As directed    Increase activity slowly   Complete by:  As directed      Allergies as of 10/24/2017      Reactions   Diclofenac Potassium Other (See Comments)   'MADE ME FEEL LIKE MY HEAD WAS ON FIRE"      Medication List    STOP taking these medications   acetaminophen 500 MG tablet Commonly known as:  TYLENOL   cephALEXin 500 MG capsule Commonly known as:  KEFLEX   gabapentin 300 MG capsule Commonly known as:  NEURONTIN   HYDROcodone-acetaminophen 5-325 MG tablet Commonly known as:  NORCO/VICODIN   ibuprofen 200 MG tablet Commonly known as:  ADVIL,MOTRIN   nortriptyline 25 MG capsule Commonly known as:  PAMELOR   promethazine 25 MG suppository Commonly known as:  PHENERGAN        Follow-up recommendations:  Activity:  as tolerated Diet:  heart healthy  Comments:  Take all medications as prescribed. Keep all follow-up appointments as scheduled.  Do not consume alcohol or use illegal drugs while on prescription medications. Report any adverse effects from your medications to your primary care provider promptly.  In the event of recurrent symptoms or worsening symptoms, call 911, a crisis hotline, or go to the nearest emergency department for evaluation.   Signed: Oneta Rackanika N Lewis, NP 10/24/2017, 10:38 AM   Patient seen, Suicide Assessment Completed.  Disposition Plan Reviewed

## 2017-10-24 NOTE — [facility] Suicide Risk Assessment (Signed)
[facility]   Principal Problem: Major depressive disorder, single episode, severe without psychosis (HCC) Discharge Diagnoses:  Patient Active Problem List   Diagnosis Date Noted  . Major depressive disorder, single episode, severe without psychotic features (HCC) [F32.2] 10/22/2017  . Major depressive disorder, single episode, severe without psychosis (HCC) [F32.2] 10/22/2017    Total Time spent with patient: 30 minutes  Musculoskeletal: Strength & Muscle Tone: within normal limits Gait & Station: normal Patient leans: N/A  Psychiatric Specialty Exam: ROS no headache, mild toothache reported, no chest pain, no nausea or vomiting, no fever   Blood pressure 100/67, pulse 94, temperature 97.7 F (36.5 C), temperature source Oral, resp. rate 16, height 5\' 5"  (1.651 m), weight 83.9 kg (185 lb).Body mass index is 30.79 kg/m.  General Appearance: Well Groomed  Eye Contact::  Good  Speech:  Normal Rate409  Volume:  Normal  Mood:  improved , denies feeling depressed, presents euthymic   Affect:  Appropriate and Full Range  Thought Process:  Linear and Descriptions of Associations: Intact  Orientation:  Full (Time, Place, and Person)  Thought Content:  no hallucinations, no delusions, not internally preoccupied   Suicidal Thoughts:  No denies any suicidal ideations, no self injurious ideations, presents future oriented   Homicidal Thoughts:  No denies any homicidal ideations or violent ideations  Memory:  recent and remote grossly intact  Judgement:  Other:  improving   Insight:  improving   Psychomotor Activity:  Normal  Concentration:  Good  Recall:  Good  Fund of Knowledge:Good  Language: Good  Akathisia:  No  Handed:  Right  AIMS (if indicated):     Assets:  Communication Skills Desire for Improvement Resilience  Sleep:  Number of Hours: 4.25  Cognition: WNL  ADL's:  Intact   Mental Status Per Nursing Assessment::   On Admission:  NA(refusing  to answer any questions)  Demographic Factors:  26 year old single female, no children, lives alone   Loss Factors: Recent break up   Historical Factors: Denies history of severe depression, denies history of suicide attempts or of self injurious behaviors, reports that recent ingestion of Gabapentin was not suicidal in intent.   Risk Reduction Factors:   Positive social support and Positive coping skills or problem solving skills  Continued Clinical Symptoms:  At this time alert, attentive, well related, pleasant, mood is " pretty good" and today presents euthymic, with a full range of affect, no thought disorder, no suicidal or self injurious ideations, no homicidal or violent ideations towards anyone ,  no hallucinations, no delusions, not internally preoccupied, future oriented .  Visible on unit, pleasant on approach. Calm. Not currently on any standing psychiatric medication .   Cognitive Features That Contribute To Risk:  No gross cognitive deficits noted upon discharge. Is alert , attentive, and oriented x 3   Suicide Risk:  Mild:  Suicidal ideation of limited frequency, intensity, duration, and specificity.  There are no identifiable plans, no associated intent, mild dysphoria and related symptoms, good self-control (both objective and subjective assessment), few other risk factors, and identifiable protective factors, including available and accessible social support.    Plan Of Care/Follow-up recommendations:  Activity:  as tolerated  Diet:  regular Tests:  NA Other:  See below  Patient is expressing readiness and desire to discharge and there are no current grounds for involuntary commitment  She is leaving in good spirits  Plans to return home Plans to follow up with  a therapist   Craige CottaFernando A Jhoana Upham, MD 10/24/2017, 11:33 AM

## 2017-10-24 NOTE — Progress Notes (Signed)
  [facility] :  Will you be returning to the same living situation after discharge:  Yes,  alone At discharge, do you have transportation home?: Yes,  arranged by patient Do you have the ability to pay for your medications: Not applicable  Release of information consent forms not completed as no follow-up is scheduled.  Patient to Follow up at: Follow-up Information    Patient refuses follow-up appointments. Follow up.           Next level of care provider has access to [facility] Link:no  Safety Planning and Suicide Prevention discussed: Yes,  with patient  Have you used any form of tobacco in the last 30 days? (Cigarettes, Smokeless Tobacco, Cigars, and/or Pipes): Patient Refused Screening  Has patient been referred to the Quitline?: N/A patient is not a smoker  Patient has been referred for addiction treatment: N/A  Lynnell ChadMareida J Grossman-Orr, LCSW 10/24/2017, 12:39 PM

## 2017-10-24 NOTE — Progress Notes (Signed)
D: Patient observed resting in bed, guarded and minimal in her interactions. Patient states she has no concerns other than L upper tooth pain. "My tooth is broken. I'm fine otherwise." Patient's affect flat, mood anxious. Per self inventory and discussions with writer, rates depression, hopelessness and anxiety all at a 0/10. Rates sleep as good, appetite as good, energy as normal and concentration as good.  States goal for today is "being able to go home, work on my Airline pilotverbal stuff." Tooth pain rated at a 10/10, no other physical complaints.   A: Medicated per orders, tylenol prn given for discomfort. Level III obs in place for safety. Emotional support offered and self inventory reviewed. Encouraged completion of Suicide Safety Plan and programming participation. Discussed POC with MD, SW.   R: Patient verbalizes understanding of POC. Patient denies SI/HI/AVH and remains safe on level III obs. Will continue to monitor closely and make verbal contact frequently.

## 2017-10-24 NOTE — [facility] Group Notes (Signed)
[facility] LCSW Group Therapy Note  Date/Time 10/24/17 10:00 AM  Type of Therapy and Topic:  Group Therapy:  Cognitive Distortions  Participation Level:  Active   Description of Group:    Patients in this group will be introduced to the topic of cognitive distortions.  Patients will identify and describe cognitive distortions, describe the feelings these distortions create for them.  Patients will identify one or more situations in their personal life where they have cognitively distorted thinking and will verbalize challenging this cognitive distortion through positive thinking skills.  Patients will practice the skill of using positive affirmations to challenge cognitive distortions.    Therapeutic Goals:  1. Patient will identify two or more cognitive distortions they have used 2. Patient will identify one or more emotions that stem from use of a cognitive distortion 3. Patient will demonstrate use of a positive affirmation to counter a cognitive distortion through discussion and/or role play. 4. Patient will describe one way cognitive distortions can be detrimental to wellness   Therapeutic Modalities:   Cognitive Behavioral Therapy Motivational Interviewing   Sylvia Mccann Sylvia Mccann MSW, LCSW 

## 2018-04-18 ENCOUNTER — Encounter (HOSPITAL_COMMUNITY): Payer: Self-pay | Admitting: Emergency Medicine

## 2018-04-18 ENCOUNTER — Ambulatory Visit (HOSPITAL_COMMUNITY)
Admission: EM | Admit: 2018-04-18 | Discharge: 2018-04-18 | Disposition: A | Discharge status: Home / Self Care | Payer: Self-pay | Attending: Family Medicine | Admitting: Family Medicine

## 2018-04-18 DIAGNOSIS — J069 Acute upper respiratory infection, unspecified: Secondary | ICD-10-CM

## 2018-04-18 DIAGNOSIS — B9789 Other viral agents as the cause of diseases classified elsewhere: Secondary | ICD-10-CM

## 2018-04-18 LAB — POCT PREGNANCY, URINE: Preg Test, Ur: NEGATIVE

## 2018-04-18 MED ORDER — DIPHENHYDRAMINE HCL 25 MG PO TABS: 25.0000 mg | ORAL_TABLET | Freq: Four times a day (QID) | ORAL | 0 refills | Status: DC

## 2018-04-18 MED ORDER — ALBUTEROL SULFATE HFA 108 (90 BASE) MCG/ACT IN AERS
INHALATION_SPRAY | RESPIRATORY_TRACT | Status: AC
  Filled 2018-04-18: qty 6.7

## 2018-04-18 MED ORDER — HYDROCODONE-HOMATROPINE 5-1.5 MG/5ML PO SYRP: 5.0000 mL | ORAL_SOLUTION | Freq: Four times a day (QID) | ORAL | 0 refills | Status: DC | PRN

## 2018-04-18 MED ORDER — ALBUTEROL SULFATE HFA 108 (90 BASE) MCG/ACT IN AERS
2.0000 | INHALATION_SPRAY | Freq: Once | RESPIRATORY_TRACT | Status: AC
  Administered 2018-04-18: 2 via RESPIRATORY_TRACT

## 2018-04-18 NOTE — ED Triage Notes (Signed)
Pt sts URI sx and requesting pregnancy test

## 2018-04-18 NOTE — Discharge Instructions (Addendum)
Inhaler given Rest and drink plenty of fluids Hydromet prescribed take as needed for improvement of cough symptoms Benadryl prescribed take as needed for symptomatic relief Continue OTC medications as needed for symptomatic relief Follow up with PCP if symptoms if symptoms persists Return or go to the ED if you have any new or worsening symptoms  Urine pregnancy was negative today

## 2018-04-18 NOTE — ED Provider Notes (Signed)
[facility]   [id number] 04/18/18 Arrival Time: 1610  SUBJECTIVE: History from: patient.  Sylvia Mccann is a 27 y.o. female who presents with abrupt onset of nasal congestion, PND, and persistent dry cough for 1 week.  Denies positive sick exposure or precipitating event.  Has tried dayquil, mucinex, and nyquil without relief.  Symptoms are made worse with alka seltzer.  Reports previous symptoms in the past.   Complains of chills, fatigue, sinus pain/ pressure, rhionrrhea, sore throat, wheezing, SOB, and nausea.  Denies chest pain, and changes in bowel or bladder habits.    Patient also requesting UPT.  Patient's last menstrual period was 03/05/2018 (exact date).  ROS: As per HPI.  Past Medical History:  Diagnosis Date  . Asthma   . Migraine    History reviewed. No pertinent surgical history. Allergies  Allergen Reactions  . Diclofenac Potassium Other (See Comments)    'MADE ME FEEL LIKE MY HEAD WAS ON FIRE"   No current facility-administered medications on file prior to encounter.    No current outpatient medications on file prior to encounter.   Social History   Socioeconomic History  . Marital status: Single    Spouse name: Not on file  . Number of children: Not on file  . Years of education: Not on file  . Highest education level: Not on file  Occupational History  . Not on file  Social Needs  . Financial resource strain: Not on file  . Food insecurity:    Worry: Not on file    Inability: Not on file  . Transportation needs:    Medical: Not on file    Non-medical: Not on file  Tobacco Use  . Smoking status: Current Every Day Smoker    Packs/day: 0.50    Types: Cigarettes  . Smokeless tobacco: Never Used  Substance and Sexual Activity  . Alcohol use: No  . Drug use: No  . Sexual activity: Yes    Birth control/protection: None  Lifestyle  . Physical activity:    Days per week: Not on file    Minutes per session: Not on file  . Stress: Not  on file  Relationships  . Social connections:    Talks on phone: Not on file    Gets together: Not on file    Attends religious service: Not on file    Active member of club or organization: Not on file    Attends meetings of clubs or organizations: Not on file    Relationship status: Not on file  . Intimate partner violence:    Fear of current or ex partner: Not on file    Emotionally abused: Not on file    Physically abused: Not on file    Forced sexual activity: Not on file  Other Topics Concern  . Not on file  Social History Narrative  . Not on file   History reviewed. No pertinent family history.  OBJECTIVE:  Vitals:   04/18/18 1634  BP: 121/74  Pulse: 91  Resp: 18  Temp: 98.8 F (37.1 C)  TempSrc: Oral  SpO2: 100%     General appearance: AOx3; nontoxic appearance HEENT: Ears: EACs clear, TMs pearly gray with visible cone of light, without erythema; Eyes: PERRL, EOMI grossly; Sinuses nontender to palpation; Nose: clear rhinorrhea; Throat: oropharynx mildly erythematous, tonsils 1+ without white tonsillar exudates, uvula midline Neck: supple without LAD Lungs: CTA bilaterally without adventitious breath sounds Heart: regular rate and rhythm.  Radial pulses 2+ symmetrical  bilaterally Skin: warm and dry Psychological: alert and cooperative; normal mood and affect   ASSESSMENT & PLAN:  1. Viral URI with cough     Meds ordered this encounter  Medications  . albuterol (PROVENTIL HFA;VENTOLIN HFA) 108 (90 Base) MCG/ACT inhaler 2 puff  . HYDROcodone-homatropine (HYCODAN) 5-1.5 MG/5ML syrup    Sig: Take 5 mLs by mouth every 6 (six) hours as needed for cough.    Dispense:  60 mL    Refill:  0    Order Specific Question:   Supervising Provider    Answer:   Isa Rankin [[id number]]  . diphenhydrAMINE (BENADRYL) 25 MG tablet    Sig: Take 1 tablet (25 mg total) by mouth every 6 (six) hours.    Dispense:  20 tablet    Refill:  0    Order Specific Question:    Supervising Provider    Answer:   Isa Rankin [[id number]]    Inhaler given Rest and drink plenty of fluids Hydromet prescribed take as needed for improvement of cough symptoms Benadryl prescribed take as needed for symptomatic relief Continue OTC medications as needed for symptomatic relief Follow up with PCP if symptoms if symptoms persists Return or go to the ED if you have any new or worsening symptoms  Urine pregnancy was negative today  Reviewed expectations re: course of current medical issues. Questions answered. Outlined signs and symptoms indicating need for more acute intervention. Patient verbalized understanding. After Visit Summary given.         Rennis Harding, PA-C 04/18/18 1732

## 2018-05-06 ENCOUNTER — Encounter (HOSPITAL_COMMUNITY): Payer: Self-pay | Admitting: Emergency Medicine

## 2018-05-06 ENCOUNTER — Emergency Department (HOSPITAL_COMMUNITY): Payer: Self-pay

## 2018-05-06 ENCOUNTER — Emergency Department (HOSPITAL_COMMUNITY)
Admission: EM | Admit: 2018-05-06 | Discharge: 2018-05-06 | Disposition: A | Discharge status: Home / Self Care | Payer: Self-pay | Attending: Emergency Medicine | Admitting: Emergency Medicine

## 2018-05-06 DIAGNOSIS — Z79899 Other long term (current) drug therapy: Secondary | ICD-10-CM | POA: Insufficient documentation

## 2018-05-06 DIAGNOSIS — J9801 Acute bronchospasm: Secondary | ICD-10-CM | POA: Insufficient documentation

## 2018-05-06 DIAGNOSIS — F329 Major depressive disorder, single episode, unspecified: Secondary | ICD-10-CM | POA: Insufficient documentation

## 2018-05-06 DIAGNOSIS — F17210 Nicotine dependence, cigarettes, uncomplicated: Secondary | ICD-10-CM | POA: Insufficient documentation

## 2018-05-06 DIAGNOSIS — J40 Bronchitis, not specified as acute or chronic: Secondary | ICD-10-CM | POA: Insufficient documentation

## 2018-05-06 LAB — I-STAT BETA HCG BLOOD, ED (MC, WL, AP ONLY)

## 2018-05-06 LAB — CBC
HCT: 40.4 % (ref 36.0–46.0)
HEMOGLOBIN: 12.3 g/dL (ref 12.0–15.0)
MCH: 27.6 pg (ref 26.0–34.0)
MCHC: 30.4 g/dL (ref 30.0–36.0)
MCV: 90.8 fL (ref 78.0–100.0)
Platelets: 316 10*3/uL (ref 150–400)
RBC: 4.45 MIL/uL (ref 3.87–5.11)
RDW: 13.8 % (ref 11.5–15.5)
WBC: 9.4 10*3/uL (ref 4.0–10.5)

## 2018-05-06 LAB — BASIC METABOLIC PANEL
Anion gap: 11 (ref 5–15)
BUN: 11 mg/dL (ref 6–20)
CALCIUM: 8.9 mg/dL (ref 8.9–10.3)
CO2: 29 mmol/L (ref 22–32)
Chloride: 102 mmol/L (ref 98–111)
Creatinine, Ser: 1 mg/dL (ref 0.44–1.00)
GFR calc Af Amer: >60 mL/min (ref >60)
GLUCOSE: 97 mg/dL (ref 70–99)
Potassium: 4.2 mmol/L (ref 3.5–5.1)
Sodium: 142 mmol/L (ref 135–145)

## 2018-05-06 LAB — I-STAT TROPONIN, ED: TROPONIN I, POC: 0 ng/mL (ref 0.00–0.08)

## 2018-05-06 MED ORDER — IPRATROPIUM BROMIDE 0.02 % IN SOLN
0.5000 mg | Freq: Once | RESPIRATORY_TRACT | Status: AC
  Administered 2018-05-06: 0.5 mg via RESPIRATORY_TRACT
  Filled 2018-05-06: qty 2.5

## 2018-05-06 MED ORDER — DOXYCYCLINE HYCLATE 100 MG PO TABS
100.0000 mg | ORAL_TABLET | Freq: Once | ORAL | Status: AC
  Administered 2018-05-06: 100 mg via ORAL
  Filled 2018-05-06: qty 1

## 2018-05-06 MED ORDER — PREDNISONE 50 MG PO TABS: ORAL_TABLET | ORAL | 0 refills | Status: DC

## 2018-05-06 MED ORDER — FENTANYL CITRATE (PF) 100 MCG/2ML IJ SOLN
50.0000 ug | Freq: Once | INTRAMUSCULAR | Status: AC
  Administered 2018-05-06: 50 ug via INTRAVENOUS
  Filled 2018-05-06: qty 2

## 2018-05-06 MED ORDER — PREDNISONE 20 MG PO TABS
60.0000 mg | ORAL_TABLET | Freq: Once | ORAL | Status: AC
  Administered 2018-05-06: 60 mg via ORAL
  Filled 2018-05-06: qty 3

## 2018-05-06 MED ORDER — ALBUTEROL SULFATE (2.5 MG/3ML) 0.083% IN NEBU
5.0000 mg | INHALATION_SOLUTION | Freq: Once | RESPIRATORY_TRACT | Status: AC
  Administered 2018-05-06: 5 mg via RESPIRATORY_TRACT
  Filled 2018-05-06: qty 6

## 2018-05-06 MED ORDER — DOXYCYCLINE HYCLATE 100 MG PO CAPS: 100.0000 mg | ORAL_CAPSULE | Freq: Two times a day (BID) | ORAL | 0 refills | Status: DC

## 2018-05-06 NOTE — ED Triage Notes (Signed)
Pt reports chest pain that has lasted a month.  Pt reports it hurts to take a deep breath.  Productive cough w/ thick clear/green mucus.

## 2018-05-06 NOTE — ED Provider Notes (Signed)
[facility] EMERGENCY DEPARTMENT Provider Note   CSN: [id number] Arrival date & time: 05/06/18  8295     History   Chief Complaint Chief Complaint  Patient presents with  . Chest Pain    HPI Sylvia Mccann is a 27 y.o. female.  The history is provided by the patient.  Cough  This is a new problem. The current episode started more than 1 week ago. The problem occurs every few minutes. The problem has been gradually worsening. The cough is productive of sputum. There has been no fever. Associated symptoms include chest pain, chills, sore throat, shortness of breath and wheezing. Pertinent negatives include no headaches.   She reports she developed a cough last month.  She was seen in urgent care, diagnosed with viral cough.  Since that time is been worse.  She reports green productive sputum, no hemoptysis.  She now reports left-sided chest wall pain worse with cough.  She also reports shortness of breath and wheezing.  She is a smoker.  Reports history of asthma. Past Medical History:  Diagnosis Date  . Asthma   . Migraine     Patient Active Problem List   Diagnosis Date Noted  . 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 No pertinent surgical history.   OB History   None      Home Medications    Prior to Admission medications   Medication Sig Start Date End Date Taking? Authorizing Provider  albuterol (PROVENTIL HFA;VENTOLIN HFA) 108 (90 Base) MCG/ACT inhaler Inhale 1-2 puffs into the lungs every 6 (six) hours as needed for wheezing or shortness of breath.   Yes [provider]  nortriptyline (PAMELOR) 25 MG capsule Take 50 mg by mouth at bedtime.   Yes [provider]    Family History No family history on file.  Social History Social History   Tobacco Use  .  Smoking status: Current Every Day Smoker    Packs/day: 0.50    Types: Cigarettes  . Smokeless tobacco: Never Used  Substance Use Topics  . Alcohol use: No  . Drug use: No     Allergies   Diclofenac potassium   Review of Systems Review of Systems  Constitutional: Positive for chills. Negative for fever.  HENT: Positive for sore throat.   Respiratory: Positive for cough, shortness of breath and wheezing.   Cardiovascular: Positive for chest pain.  Neurological: Negative for headaches.  All other systems reviewed and are negative.    Physical Exam Updated Vital Signs BP 117/72 (BP Location: Right Arm)   Pulse 95   Temp 98 F (36.7 C) (Oral)   Resp 18   Ht 1.651 m (5\' 5" )   Wt 81.6 kg (180 lb)   SpO2 100%   BMI 29.95 kg/m   Physical Exam CONSTITUTIONAL: Well developed/well nourished HEAD: Normocephalic/atraumatic EYES: EOMI/PERRL ENMT: Mucous membranes moist, uvula midline without erythema or exudate NECK: supple no meningeal signs SPINE/BACK:entire spine nontender CV: S1/S2 noted, no murmurs/rubs/gallops noted LUNGS: Coarse wheezing bilaterally Chest-diffuse left-sided chest wall tenderness no crepitus ABDOMEN: soft, nontender, no rebound or guarding, bowel sounds noted throughout abdomen GU:no cva tenderness NEURO: Pt is awake/alert/appropriate, moves all extremitiesx4.  No facial droop.   EXTREMITIES: pulses normal/equal, full ROM, no edema or calf tenderness SKIN: warm, color normal PSYCH: no abnormalities of mood noted, alert and oriented to situation  ED Treatments / Results  Labs (all labs ordered are listed, but only abnormal results are displayed) Labs Reviewed  BASIC METABOLIC PANEL  CBC  I-STAT TROPONIN, ED  I-STAT BETA HCG BLOOD, ED ([facility], [facility], [facility] ONLY)    EKG EKG Interpretation  Date/Time:  Friday May 06 2018 03:29:14 EDT Ventricular Rate:  96 PR Interval:  172 QRS Duration: 80 QT Interval:  330 QTC Calculation: 416 R Axis:   81 Text  Interpretation:  Normal sinus rhythm Normal ECG Confirmed by Zadie RhineWickline, Kyndall Chaplin ([id number]) on 05/06/2018 4:50:03 AM   Radiology Dg Chest 2 View  Result Date: 05/06/2018 CLINICAL DATA:  Chest pain for months, worse with inspiration. Cough. EXAM: CHEST - 2 VIEW COMPARISON:  None. FINDINGS: Cardiomediastinal silhouette is normal. No pleural effusions or focal consolidations. Trachea projects midline and there is no pneumothorax. Soft tissue planes and included osseous structures are non-suspicious. Bilateral nipple piercings. IMPRESSION: Negative. Electronically Signed   By: Awilda Metroourtnay  Bloomer M.D.   On: 05/06/2018 04:56    Procedures Procedures  Medications Ordered in ED Medications  fentaNYL (SUBLIMAZE) injection 50 mcg (50 mcg Intravenous Given 05/06/18 0551)  albuterol (PROVENTIL) (2.5 MG/3ML) 0.083% nebulizer solution 5 mg (5 mg Nebulization Given 05/06/18 0551)  ipratropium (ATROVENT) nebulizer solution 0.5 mg (0.5 mg Nebulization Given 05/06/18 0551)  predniSONE (DELTASONE) tablet 60 mg (60 mg Oral Given 05/06/18 0551)  doxycycline (VIBRA-TABS) tablet 100 mg (100 mg Oral Given 05/06/18 0551)     Initial Impression / Assessment and Plan / ED Course  I have reviewed the triage vital signs and the nursing notes.  Pertinent labs & imaging results that were available during my care of the patient were reviewed by me and considered in my medical decision making (see chart for details).     Presents with cough for a month, now having chest wall pain.  She has significant wheezing on exam.  This is now improved after nebulizers. She was walking in the ER in no distress.  While in the room, pulse ox remained above 95%, no tachycardia noted Will treat as an outpatient.  She has albuterol home, will add on prednisone as well as doxycycline.  Advised to quit smoking.  We discussed strict return precautions  Final Clinical Impressions(s) / ED Diagnoses   Final diagnoses:  Bronchitis  Acute  bronchospasm    ED Discharge Orders        Ordered    doxycycline (VIBRAMYCIN) 100 MG capsule  2 times daily     05/06/18 0711    predniSONE (DELTASONE) 50 MG tablet     05/06/18 [id number]       Zadie RhineWickline, Nioka Thorington, MD 05/06/18 0715

## 2018-06-11 ENCOUNTER — Encounter (HOSPITAL_COMMUNITY): Payer: Self-pay | Admitting: *Deleted

## 2018-06-11 ENCOUNTER — Inpatient Hospital Stay (HOSPITAL_COMMUNITY)
Admission: AD | Admit: 2018-06-11 | Discharge: 2018-06-11 | Disposition: A | Discharge status: Home / Self Care | Payer: Self-pay | Source: Ambulatory Visit | Attending: Family Medicine | Admitting: Family Medicine

## 2018-06-11 DIAGNOSIS — N926 Irregular menstruation, unspecified: Secondary | ICD-10-CM | POA: Insufficient documentation

## 2018-06-11 DIAGNOSIS — R102 Pelvic and perineal pain: Secondary | ICD-10-CM | POA: Insufficient documentation

## 2018-06-11 DIAGNOSIS — F17210 Nicotine dependence, cigarettes, uncomplicated: Secondary | ICD-10-CM | POA: Insufficient documentation

## 2018-06-11 LAB — URINALYSIS, ROUTINE W REFLEX MICROSCOPIC
RBC / HPF: >50 RBC/hpf — ABNORMAL HIGH (ref 0–5)
WBC, UA: >50 WBC/hpf — ABNORMAL HIGH (ref 0–5)

## 2018-06-11 LAB — HCG, QUANTITATIVE, PREGNANCY

## 2018-06-11 NOTE — MAU Note (Signed)
Pt stated had a positive HPT 2 days ago . Stared having abd pain and bleeding today  LMP 7/28

## 2018-06-11 NOTE — MAU Provider Note (Signed)
History     CSN: [id number]  Arrival date and time: 06/11/18 2109   First Provider Initiated Contact with Patient 06/11/18 2149      Chief Complaint  Patient presents with  . Vaginal Bleeding   Vaginal Bleeding  The patient's primary symptoms include pelvic pain and vaginal bleeding. This is a new problem. The current episode started today. The problem occurs constantly. The problem has been unchanged. Pain severity now: 7/10. The problem affects both sides. She is not pregnant. Associated symptoms include nausea and vomiting. Pertinent negatives include no chills, dysuria, fever, frequency or urgency. The vaginal discharge was bloody. The vaginal bleeding is typical of menses. She has been passing clots (about the size of a quarter ). She has not been passing tissue. Nothing aggravates the symptoms. She has tried NSAIDs for the symptoms. The treatment provided no relief. She uses nothing for contraception. Her menstrual history has been irregular (LMP 05/22/18. Patient reports +UPT at home 2 days ago).    OB History   None     Past Medical History:  Diagnosis Date  . Asthma   . Migraine     Past Surgical History:  Procedure Laterality Date  . NO PAST SURGERIES      History reviewed. No pertinent family history.  Social History   Tobacco Use  . Smoking status: Current Every Day Smoker    Packs/day: 0.50    Types: Cigarettes  . Smokeless tobacco: Never Used  Substance Use Topics  . Alcohol use: No  . Drug use: No    Allergies:  Allergies  Allergen Reactions  . Diclofenac Potassium Other (See Comments)    'MADE ME FEEL LIKE MY HEAD WAS ON FIRE"    Medications Prior to Admission  Medication Sig Dispense Refill Last Dose  . albuterol (PROVENTIL HFA;VENTOLIN HFA) 108 (90 Base) MCG/ACT inhaler Inhale 1-2 puffs into the lungs every 6 (six) hours as needed for wheezing or shortness of breath.   Past Week at Unknown time  . doxycycline (VIBRAMYCIN) 100 MG capsule Take  1 capsule (100 mg total) by mouth 2 (two) times daily. One po bid x 7 days 14 capsule 0   . nortriptyline (PAMELOR) 25 MG capsule Take 50 mg by mouth at bedtime.   Past Month at Unknown time  . predniSONE (DELTASONE) 50 MG tablet One tab PO daily for 4 days 4 tablet 0     Review of Systems  Constitutional: Negative for chills and fever.  Gastrointestinal: Positive for nausea and vomiting.  Genitourinary: Positive for pelvic pain and vaginal bleeding. Negative for dysuria, frequency and urgency.   Physical Exam   Blood pressure (!) 117/54, pulse 64, temperature 98.5 F (36.9 C), resp. rate 18, height 5\' 5"  (1.651 m), weight 84.8 kg, last menstrual period 05/22/2018.  Physical Exam  Nursing note and vitals reviewed. Constitutional: She is oriented to person, place, and time. She appears well-developed and well-nourished. No distress.  HENT:  Head: Normocephalic.  Cardiovascular: Normal rate.  Respiratory: Effort normal.  GI: Soft. There is no tenderness. There is no rebound.  Neurological: She is alert and oriented to person, place, and time.  Skin: Skin is warm and dry.  Psychiatric: She has a normal mood and affect.     Results for orders placed or performed during the hospital encounter of 06/11/18 (from the past 24 hour(s))  Urinalysis, Routine w reflex microscopic     Status: Abnormal   Collection Time: 06/11/18  9:33 PM  Result  Value Ref Range   Color, Urine RED (A) YELLOW   APPearance CLOUDY (A) CLEAR   Specific Gravity, Urine  1.005 - 1.030    TEST NOT REPORTED DUE TO COLOR INTERFERENCE OF URINE PIGMENT   pH  5.0 - 8.0    TEST NOT REPORTED DUE TO COLOR INTERFERENCE OF URINE PIGMENT   Glucose, UA (A) NEGATIVE mg/dL    TEST NOT REPORTED DUE TO COLOR INTERFERENCE OF URINE PIGMENT   Hgb urine dipstick (A) NEGATIVE    TEST NOT REPORTED DUE TO COLOR INTERFERENCE OF URINE PIGMENT   Bilirubin Urine (A) NEGATIVE    TEST NOT REPORTED DUE TO COLOR INTERFERENCE OF URINE PIGMENT    Ketones, ur (A) NEGATIVE mg/dL    TEST NOT REPORTED DUE TO COLOR INTERFERENCE OF URINE PIGMENT   Protein, ur (A) NEGATIVE mg/dL    TEST NOT REPORTED DUE TO COLOR INTERFERENCE OF URINE PIGMENT   Nitrite (A) NEGATIVE    TEST NOT REPORTED DUE TO COLOR INTERFERENCE OF URINE PIGMENT   Leukocytes, UA (A) NEGATIVE    TEST NOT REPORTED DUE TO COLOR INTERFERENCE OF URINE PIGMENT   RBC / HPF >50 (H) 0 - 5 RBC/hpf   WBC, UA >50 (H) 0 - 5 WBC/hpf   Bacteria, UA FEW (A) NONE SEEN   Squamous Epithelial / LPF 6-10 0 - 5   Mucus PRESENT   hCG, quantitative, pregnancy     Status: None   Collection Time: 06/11/18  9:57 PM  Result Value Ref Range   hCG, Beta Chain, Quant, S <1 <5 mIU/mL    MAU Course  Procedures  MDM Patient informed that UPT and HCG are both negative. Not currently pregnant or with recent pregnancy. Patient to FU with PCP about abnormal cycles.   Assessment and Plan   1. Irregular menstrual cycle    DC home Comfort measures reviewed  Bleeding precautions RX: none  Return to MAU as needed  Follow-up Information    Department, [facility].   Contact information: [address] [place] [address] [phone]           Thressa ShellerHeather Heaton Sarin 06/11/2018, 9:50 PM

## 2018-06-11 NOTE — Discharge Instructions (Signed)
In late 2019, the [facility] will be moving to the [place] campus. At that time, the MAU (Maternity Admissions Unit), where you are being seen today, will no longer take care of non-pregnant patients. We strongly encourage you to find a doctor's office before that time, so that you can be seen with any GYN concerns, like vaginal discharge, urinary tract infection, etc.. in a timely manner. ° °In order to make an office visit more convenient, the [facility] will be offering evening hours with same-day appointments, walk-in appointments and scheduled appointments available during this time. ° °[facility] Hours: °Monday - 8am - 7:30 pm with walk-in between 4pm- 7:30 pm °Tuesday - 8 am - 5 pm (starting 01/25/18 we will be open late and accepting walk-ins from 4pm - 7:30pm) °Wednesday - 8 am - 5 pm (starting 04/27/18 we will be open late and accepting walk-ins from 4pm - 7:30pm) °Thursday 8 am - 5 pm (starting 07/28/18 we will be open late and accepting walk-ins from 4pm - 7:30pm) °Friday 8 am - 5 pm ° °For an appointment please call the [facility] at [phone] ° °For urgent needs, [facility] is also available for management of urgent GYN complaints such as vaginal discharge or urinary tract infections. ° ° ° ° ° °

## 2018-10-07 ENCOUNTER — Encounter (HOSPITAL_COMMUNITY): Payer: Self-pay | Admitting: Emergency Medicine

## 2018-10-07 ENCOUNTER — Other Ambulatory Visit: Payer: Self-pay

## 2018-10-07 ENCOUNTER — Emergency Department (HOSPITAL_COMMUNITY)
Admission: EM | Admit: 2018-10-07 | Discharge: 2018-10-07 | Disposition: A | Discharge status: Home / Self Care | Payer: Self-pay | Attending: Emergency Medicine | Admitting: Emergency Medicine

## 2018-10-07 DIAGNOSIS — R590 Localized enlarged lymph nodes: Secondary | ICD-10-CM | POA: Insufficient documentation

## 2018-10-07 DIAGNOSIS — R591 Generalized enlarged lymph nodes: Secondary | ICD-10-CM

## 2018-10-07 NOTE — ED Provider Notes (Signed)
[facility] EMERGENCY DEPARTMENT Provider Note   CSN: [id number] Arrival date & time: 10/07/18  0830     History   Chief Complaint Chief Complaint  Patient presents with  . Lump on neck    HPI Sylvia Mccann is a 27 y.o. female who presents with a lump on the left side of the neck. PMH significant for depression, smoking. She states that she noticed the lump about 5 days ago because it was painful. It has become less painful since then but it's still there an slightly red. It's worse when she turns her neck to the right. She denies fever, chills, URI symptoms, cough, chest pain, SOB. No weight loss, night sweats. She was concerned because a friend's mom had a mass on the side of her neck which turned out to be cancer and so she was urged to come get it checked.  HPI  Past Medical History:  Diagnosis Date  . Asthma   . Migraine     Patient Active Problem List   Diagnosis Date Noted  . 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 NO PAST SURGERIES       OB History   No obstetric history on file.      Home Medications    Prior to Admission medications   Medication Sig Start Date End Date Taking? Authorizing Provider  albuterol (PROVENTIL HFA;VENTOLIN HFA) 108 (90 Base) MCG/ACT inhaler Inhale 1-2 puffs into the lungs every 6 (six) hours as needed for wheezing or shortness of breath.    [provider]  doxycycline (VIBRAMYCIN) 100 MG capsule Take 1 capsule (100 mg total) by mouth 2 (two) times daily. One po bid x 7 days 05/06/18   Zadie RhineWickline, Donald, MD  nortriptyline (PAMELOR) 25 MG capsule Take 50 mg by mouth at bedtime.    [provider]  predniSONE (DELTASONE) 50 MG tablet One tab PO daily for 4 days 05/06/18   Zadie RhineWickline, Donald, MD    Family  History No family history on file.  Social History Social History   Tobacco Use  . Smoking status: Current Every Day Smoker    Packs/day: 0.50    Types: Cigarettes  . Smokeless tobacco: Never Used  Substance Use Topics  . Alcohol use: No  . Drug use: No     Allergies   Diclofenac potassium   Review of Systems Review of Systems  Constitutional: Negative for fever.  HENT: Negative for congestion, ear pain, sinus pain, sneezing, sore throat, trouble swallowing and voice change.   Musculoskeletal: Negative for neck pain.  Skin:       +neck mass     Physical Exam Updated Vital Signs BP 110/68 (BP Location: Right Arm)   Pulse 74   Temp 98.4 F (36.9 C) (Oral)   Resp 12   Ht 5\' 5"  (1.651 m)   Wt 81.6 kg   SpO2 98%   BMI 29.95 kg/m   Physical Exam Vitals signs and nursing note reviewed.  Constitutional:      General: She is not in acute distress.    Appearance: Normal appearance. She is well-developed. She is not ill-appearing.  HENT:     Head: Normocephalic and atraumatic.     Right Ear: Tympanic membrane normal.     Left Ear: Tympanic membrane normal.     Nose:  No congestion or rhinorrhea.     Mouth/Throat:     Mouth: Mucous membranes are moist.     Pharynx: No oropharyngeal exudate or posterior oropharyngeal erythema.  Eyes:     General: No scleral icterus.       Right eye: No discharge.        Left eye: No discharge.     Conjunctiva/sclera: Conjunctivae normal.     Pupils: Pupils are equal, round, and reactive to light.  Neck:     Musculoskeletal: Normal range of motion.   Cardiovascular:     Rate and Rhythm: Normal rate.  Pulmonary:     Effort: Pulmonary effort is normal. No respiratory distress.  Abdominal:     General: There is no distension.  Skin:    General: Skin is warm and dry.  Neurological:     Mental Status: She is alert and oriented to person, place, and time.  Psychiatric:        Behavior: Behavior normal.      ED Treatments /  Results  Labs (all labs ordered are listed, but only abnormal results are displayed) Labs Reviewed - No data to display  EKG None  Radiology No results found.  Procedures Procedures (including critical care time)  Medications Ordered in ED Medications - No data to display   Initial Impression / Assessment and Plan / ED Course  I have reviewed the triage vital signs and the nursing notes.  Pertinent labs & imaging results that were available during my care of the patient were reviewed by me and considered in my medical decision making (see chart for details).  27 year old female presents with tender isolated enlarged lymph node over there left side of the neck for the past several days. Soft tissue US was used and the mass is well-rounded and hypoechoic so likely a lymph node.  She is concerned because a close friend ended up having cancer. She has no infectious symptoms however the lymph node is tender. Her vitals are normal and she is well appearing. At this point I think we can watch and wait to see if it improves or not. She was given strict return precautions.  Final Clinical Impressions(s) / ED Diagnoses   Final diagnoses:  Lymphadenopathy of head and neck    ED Discharge Orders    None       Bethel Born, PA-C 10/07/18 1009    Donnetta Hutching, MD 10/10/18 1203

## 2018-10-07 NOTE — ED Triage Notes (Signed)
Lump on left neck began on Tuesday. Denies ear pain or drainage or recent sickness.

## 2018-10-07 NOTE — Discharge Instructions (Signed)
Please return if worsening.

## 2018-10-25 ENCOUNTER — Emergency Department (HOSPITAL_COMMUNITY)
Admission: EM | Admit: 2018-10-25 | Discharge: 2018-10-25 | Disposition: A | Discharge status: Home / Self Care | Payer: Self-pay | Attending: Emergency Medicine | Admitting: Emergency Medicine

## 2018-10-25 ENCOUNTER — Encounter (HOSPITAL_COMMUNITY): Payer: Self-pay | Admitting: *Deleted

## 2018-10-25 DIAGNOSIS — R590 Localized enlarged lymph nodes: Secondary | ICD-10-CM | POA: Insufficient documentation

## 2018-10-25 DIAGNOSIS — R591 Generalized enlarged lymph nodes: Secondary | ICD-10-CM

## 2018-10-25 DIAGNOSIS — J45909 Unspecified asthma, uncomplicated: Secondary | ICD-10-CM | POA: Insufficient documentation

## 2018-10-25 DIAGNOSIS — Z79899 Other long term (current) drug therapy: Secondary | ICD-10-CM | POA: Insufficient documentation

## 2018-10-25 DIAGNOSIS — F17210 Nicotine dependence, cigarettes, uncomplicated: Secondary | ICD-10-CM | POA: Insufficient documentation

## 2018-10-25 NOTE — ED Notes (Signed)
Pt. Stated, I might have been around someone with HIV

## 2018-10-25 NOTE — ED Notes (Signed)
Pt verbalized understanding of discharge instructions provided by PA and denies any further questions at this time.

## 2018-10-25 NOTE — Discharge Instructions (Addendum)
Please read attached information. If you experience any new or worsening signs or symptoms please return to the emergency room for evaluation. Please follow-up with your primary care provider or specialist in 1 week as discussed for further testing.
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
## 2018-10-25 NOTE — ED Triage Notes (Signed)
Pt in for recheck of a lump on the left side of her neck, seen for same a few weeks ago and it has not gone away, no distress noted

## 2018-10-26 LAB — HIV ANTIBODY (ROUTINE TESTING W REFLEX): HIV Screen 4th Generation wRfx: NONREACTIVE

## 2018-11-14 ENCOUNTER — Encounter (HOSPITAL_COMMUNITY): Payer: Self-pay

## 2018-11-14 ENCOUNTER — Emergency Department (HOSPITAL_COMMUNITY)
Admission: EM | Admit: 2018-11-14 | Discharge: 2018-11-14 | Disposition: A | Discharge status: Home / Self Care | Payer: Self-pay | Attending: Emergency Medicine | Admitting: Emergency Medicine

## 2018-11-14 ENCOUNTER — Other Ambulatory Visit: Payer: Self-pay

## 2018-11-14 DIAGNOSIS — R112 Nausea with vomiting, unspecified: Secondary | ICD-10-CM | POA: Insufficient documentation

## 2018-11-14 DIAGNOSIS — B9789 Other viral agents as the cause of diseases classified elsewhere: Secondary | ICD-10-CM

## 2018-11-14 DIAGNOSIS — J45909 Unspecified asthma, uncomplicated: Secondary | ICD-10-CM | POA: Insufficient documentation

## 2018-11-14 DIAGNOSIS — J069 Acute upper respiratory infection, unspecified: Secondary | ICD-10-CM | POA: Insufficient documentation

## 2018-11-14 DIAGNOSIS — F17210 Nicotine dependence, cigarettes, uncomplicated: Secondary | ICD-10-CM | POA: Insufficient documentation

## 2018-11-14 DIAGNOSIS — R197 Diarrhea, unspecified: Secondary | ICD-10-CM

## 2018-11-14 LAB — PREGNANCY, URINE: Preg Test, Ur: NEGATIVE

## 2018-11-14 LAB — INFLUENZA PANEL BY PCR (TYPE A & B)
Influenza A By PCR: NEGATIVE
Influenza B By PCR: NEGATIVE

## 2018-11-14 MED ORDER — FLUTICASONE PROPIONATE 50 MCG/ACT NA SUSP: 1.0000 | Freq: Every day | NASAL | 0 refills | Status: DC

## 2018-11-14 MED ORDER — DIPHENHYDRAMINE HCL 25 MG PO CAPS
25.0000 mg | ORAL_CAPSULE | Freq: Once | ORAL | Status: AC
  Administered 2018-11-14: 25 mg via ORAL
  Filled 2018-11-14: qty 1

## 2018-11-14 MED ORDER — ACETAMINOPHEN 325 MG PO TABS
650.0000 mg | ORAL_TABLET | Freq: Once | ORAL | Status: AC
  Administered 2018-11-14: 650 mg via ORAL
  Filled 2018-11-14: qty 2

## 2018-11-14 MED ORDER — ONDANSETRON HCL 4 MG/2ML IJ SOLN
4.0000 mg | Freq: Once | INTRAMUSCULAR | Status: AC
  Administered 2018-11-14: 4 mg via INTRAVENOUS
  Filled 2018-11-14: qty 2

## 2018-11-14 MED ORDER — BENZONATATE 100 MG PO CAPS: 100.0000 mg | ORAL_CAPSULE | Freq: Three times a day (TID) | ORAL | 0 refills | Status: DC

## 2018-11-14 NOTE — Discharge Instructions (Addendum)
At this time you appear to have a viral upper respiratory infection.  Treatment of symptoms is the best plan.  This includes tylenol or motrin for aches and pains, gargling with salt water for sore throat, increased fluid intake, especially hot drinks to soothe the throat.  Over the counter medications that include decongestants and cough suppressants may also help.  Expect to have symptoms for 5-10 days.  Return to the ER for worsening condition or new concerning symptoms.  1. Medications: flonase, tessalon, usual home medications 2. Treatment: rest, drink plenty of fluids, take tylenol or ibuprofen for fever control 3. Follow Up: Please follow up with your primary doctor in 3 days for discussion of your diagnoses and further evaluation after today's visit; if you do not have a primary care doctor use the resource guide provided to find one; Return to the ER for high fevers, difficulty breathing or other concerning symptoms  Continue to stay well-hydrated. Gargle warm salt water and spit it out for sore throat. Take benadryl or other antihistamine to decrease secretions and for watery itchy eyes. Continued to alternate between Tylenol and ibuprofen for pain. Follow up with your primary care doctor in 5-7 days or referral for urgent care for recheck of ongoing symptoms however return to emergency department for emergent changing or worsening of symptoms.   Read the instructions below on reasons to return to the emergency department and to learn more about your diagnosis.  Use over the counter medications for symptomatic relief as we discussed (mucinex as a decongestant, Tylenol for fever/pain, Motrin/Ibuprofen for muscle aches). If prescribed a cough suppressant during your visit, do not operate heavy machinery with in 5 hours of taking this medication. Follow up with your primary care doctor in 4 days if your symptoms persist.  Your more than welcome to return to the emergency department if symptoms worsen  or become concerning.  Upper Respiratory Infection, Adult  An upper respiratory infection (URI) is also sometimes known as the common cold. Most people improve within 1 week, but symptoms can last up to 2 weeks. A residual cough may last even longer.   URI is most commonly caused by a virus. Viruses are NOT treated with antibiotics. You can easily spread the virus to others by oral contact. This includes kissing, sharing a glass, coughing, or sneezing. Touching your mouth or nose and then touching a surface, which is then touched by another person, can also spread the virus.   TREATMENT  Treatment is directed at relieving symptoms. There is no cure. Antibiotics are not effective, because the infection is caused by a virus, not by bacteria. Treatment may include:  Increased fluid intake. Sports drinks offer valuable electrolytes, sugars, and fluids.  Breathing heated mist or steam (vaporizer or shower).  Eating chicken soup or other clear broths, and maintaining good nutrition.  Getting plenty of rest.  Using gargles or lozenges for comfort.  Controlling fevers with ibuprofen or acetaminophen as directed by your caregiver.  Increasing usage of your inhaler if you have asthma.  Return to work when your temperature has returned to normal.   SEEK MEDICAL CARE IF:  After the first few days, you feel you are getting worse rather than better.  You develop worsening shortness of breath, or brown or red sputum. These may be signs of pneumonia.  You develop yellow or brown nasal discharge or pain in the face, especially when you bend forward. These may be signs of sinusitis.  You develop a fever,  swollen neck glands, pain with swallowing, or white areas in the back of your throat. These may be signs of strep throat.  Severe headache or stiff neck. Uncontrolled vomiting. Lightheadedness, feeling faint, or if you pass out. Problems breathing or shortness of breath. Weakness or inability to walk. Any  other concerning symptoms or concerns. If not improving over 7 days or if feeling worse at any time.  RESOURCE GUIDE  Dental Problems  Patients with Medicaid: [facility]                                            [facility] [phone]                                                                   [phone] Phone:  [phone]                                                                             Phone:  [phone]  If unable to pay or uninsured, contact:  [facility] or [facility] to become qualified for the adult dental clinic.  Chronic Pain Problems Contact [facility]  [phone] Patients need to be referred by their primary care doctor.  Insufficient Money for Medicine Contact [employer] Way:  call "211" or [facility] [phone].  No Primary Care Doctor Call [facility]  [phone] Other agencies that provide inexpensive medical care    [facility]  [phone]    [facility]  [phone]    [facility]  [phone]    [facility]  [phone]    [facility]  [phone]    [facility]  [phone]  Psychological Services [facility]  [phone] [facility]  [phone] [facility]   [phone] (emergency services [phone])  Abuse/Neglect [facility] Hotline [phone] [facility] Hotline [phone] (After Hours)  Emergency Shelter [facility] [phone]  M[facility] at [facility] [place] [facility] [phone] [facility] [phone]  MRSA Hotline #:   (206) 135-3961214 [phone]  [facility]  [facility]     [employer]                          [facility] [address] [place]                        [address]          [address]  [address] [place]  Sela Hua Phone:  [phone]                                     Phone:  [phone]                   Phone:  [place] Phone:  [place] [phone] (After Hours)   If you develop symptoms of Shortness of Breath, Chest Pain, Swelling of lips, mouth or tongue or if your condition becomes worse with any new symptoms, see your doctor or return to the Emergency Department for immediate care. Emergency services are not intended to be a substitute for comprehensive medical attention.  Please contact your doctor for follow up if not improving as expected.   Call your doctor in 5-7 days or as directed if there is no improvement.   Community Resources: *IF YOU ARE IN IMMEDIATE DANGER CALL 911!  Abuse/Neglect:  Family Services Crisis Hotline [facility]): 639-815-8762 Center Against Violence [facility]): [phone]  After hours, holidays and weekends: [phone] National Domestic Violence Hotline: [place]: [place]: Dannielle Karvonen: 574-260-3782  Health Clinics:  Urgent [place] ([place]): [phone] Monday - Friday 8 AM - 9 PM, Saturday and Sunday 10 AM - 9 PM  [facility] [place]: [phone] Monday - Friday 8 AM - 5 PM  [facility]  [address]: [phone] Monday- Friday 8:30 AM - 5:30 PM, Sat 9 AM - 1 PM  24 HR [place] [facility] [address]: [phone] [facility] [address]: [phone] [facility] [address]: [phone]  24 HR [facility]: [address] [phone]  Cultures: If culture results are positive, we will notify you if a change in treatment is necessary.  LABORATORY TESTS:         If you had any labs drawn in the ED that have not resulted by the time you are  discharged home, we will review these lab results and the treatment given to you.  If there is any further treatment or notification needed, we will contact you by phone, or letter.  "PLEASE ENSURE THAT YOU HAVE GIVEN US YOUR CURRENT WORKING PHONE NUMBER AND YOUR CURRENT ADDRESS, so that we can contact you if needed."  RADIOLOGY TESTS:  If the referred physician wants todays x-rays, please call the hospitals Radiology Department the day before your doctors appointment. [place]     [phone] [place]   [phone] [place]     [phone]  Our doctors and staff appreciate your choosing [place] for your emergency medical care needs. We are here to serve you.

## 2018-11-14 NOTE — ED Triage Notes (Signed)
[facility]- pt coming from home with complaint of n/v. She also reports some diarrhea. Only medical hx of diarrhea.   138/90 90 98.1

## 2018-11-14 NOTE — ED Provider Notes (Signed)
[facility] Provider Note   CSN: [id number] Arrival date & time: 11/14/18  [id number]   History   Chief Complaint Chief Complaint  Patient presents with  . Emesis    HPI Sylvia Mccann is a 28 y.o. female with a PMH of asthma presents with nausea, vomiting, and diarrhea onset yesterday. Patient reports 3 episodes of vomiting and 1 episode of diarrhea. Patient reports associated cough, sore throat, chest tightness, mild frontal headache, rhinorrhea, and congestion. Patient states she has tried tylenol with minimal relief. Patient states she has used her albuterol inhaler as needed for the cough. Patient reports sick contacts at work. Patient denies abdominal pain, shortness of breath, chest pain, fever, or blood in vomit/stool. Patient denies getting her flu shot this year. LMP around 2 weeks ago.  HPI  Past Medical History:  Diagnosis Date  . Asthma   . Migraine     Patient Active Problem List   Diagnosis Date Noted  . 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 NO PAST SURGERIES       OB History   No obstetric history on file.      Home Medications    Prior to Admission medications   Medication Sig Start Date End Date Taking? Authorizing Provider  albuterol (PROVENTIL HFA;VENTOLIN HFA) 108 (90 Base) MCG/ACT inhaler Inhale 1-2 puffs into the lungs every 6 (six) hours as needed for wheezing or shortness of breath.    [provider]  benzonatate (TESSALON) 100 MG capsule Take 1 capsule (100 mg total) by mouth every 8 (eight) hours. 11/14/18   Carlyle BasquesHernandez, Rowena Moilanen P, PA-C  doxycycline (VIBRAMYCIN) 100 MG capsule Take 1 capsule (100 mg total) by mouth 2 (two) times daily. One po bid x 7 days 05/06/18   Zadie RhineWickline, Donald, MD  fluticasone [facility](FLONASE) 50  MCG/ACT nasal spray Place 1 spray into both nostrils daily. 11/14/18   Carlyle BasquesHernandez, Lisamarie Coke P, PA-C  nortriptyline (PAMELOR) 25 MG capsule Take 50 mg by mouth at bedtime.    [provider]  predniSONE (DELTASONE) 50 MG tablet One tab PO daily for 4 days 05/06/18   Zadie RhineWickline, Donald, MD    Family History History reviewed. No pertinent family history.  Social History Social History   Tobacco Use  . Smoking status: Current Every Day Smoker    Packs/day: 0.50    Types: Cigarettes  . Smokeless tobacco: Never Used  Substance Use Topics  . Alcohol use: No  . Drug use: No     Allergies   Diclofenac potassium   Review of Systems Review of Systems  Constitutional: Positive for chills. Negative for activity change, appetite change, fatigue and fever.  HENT: Positive for congestion, rhinorrhea and sore throat. Negative for ear pain and postnasal drip.   Eyes: Negative for pain, redness and itching.  Respiratory: Positive for cough and chest tightness. Negative for shortness of breath.   Cardiovascular: Negative for chest pain.  Gastrointestinal: Positive for diarrhea, nausea and vomiting. Negative for abdominal pain.  Genitourinary: Negative for dysuria.  Musculoskeletal: Negative for myalgias.  Skin: Negative for rash.  Allergic/Immunologic: Negative for environmental allergies and immunocompromised state.  Neurological: Positive for headaches. Negative for dizziness, weakness and numbness.  Hematological: Positive for adenopathy.    Physical Exam Updated Vital Signs BP 109/68 (BP Location: Right Arm)   Pulse  74   Temp 98.5 F (36.9 C) (Oral)   Resp 18   SpO2 98%   Physical Exam Vitals signs and nursing note reviewed.  Constitutional:      General: She is not in acute distress.    Appearance: She is well-developed. She is not diaphoretic.  HENT:     Head: Normocephalic and atraumatic.     Right Ear: Tympanic membrane, ear canal and external ear normal. No middle ear  effusion.     Left Ear: Tympanic membrane, ear canal and external ear normal.  No middle ear effusion.     Nose: Mucosal edema, congestion and rhinorrhea present.     Mouth/Throat:     Mouth: Mucous membranes are moist.     Pharynx: Uvula midline. No oropharyngeal exudate or posterior oropharyngeal erythema.  Eyes:     General:        Right eye: No discharge.        Left eye: No discharge.     Extraocular Movements: Extraocular movements intact.     Conjunctiva/sclera: Conjunctivae normal.     Pupils: Pupils are equal, round, and reactive to light.  Neck:     Musculoskeletal: Normal range of motion and neck supple.  Cardiovascular:     Rate and Rhythm: Normal rate and regular rhythm.     Heart sounds: Normal heart sounds. No murmur. No friction rub. No gallop.   Pulmonary:     Effort: Pulmonary effort is normal. No respiratory distress.     Breath sounds: Normal breath sounds. No wheezing or rales.  Abdominal:     General: There is no distension.     Palpations: Abdomen is soft.     Tenderness: There is no abdominal tenderness. There is no guarding.  Musculoskeletal: Normal range of motion.  Lymphadenopathy:     Cervical: No cervical adenopathy.  Skin:    General: Skin is warm.     Findings: No rash.  Neurological:     Mental Status: She is alert and oriented to person, place, and time.      ED Treatments / Results  Labs (all labs ordered are listed, but only abnormal results are displayed) Labs Reviewed  PREGNANCY, URINE  INFLUENZA PANEL BY PCR (TYPE A & B)    EKG None  Radiology No results found.  Procedures Procedures (including critical care time)  Medications Ordered in ED Medications  ondansetron (ZOFRAN) injection 4 mg (4 mg Intravenous Given 11/14/18 1014)  acetaminophen (TYLENOL) tablet 650 mg (650 mg Oral Given 11/14/18 1121)  diphenhydrAMINE (BENADRYL) capsule 25 mg (25 mg Oral Given 11/14/18 1151)     Initial Impression / Assessment and Plan / ED  Course  I have reviewed the triage vital signs and the nursing notes.  Pertinent labs & imaging results that were available during my care of the patient were reviewed by me and considered in my medical decision making (see chart for details).  Clinical Course as of Nov 14 1154  Mon Nov 14, 2018  1144 Patient reports symptoms have improved while in the ER.   [AH]  1155 Influenza test is negative.   Influenza panel by PCR (type A & B) [AH]    Clinical Course User Index [AH] Carlyle Basques P, PA-C   Patients symptoms are consistent with URI, likely viral etiology. Influenza test is negative. Symptoms have been controlled while in the ER. Patient reports improvement. Discussed that antibiotics are not indicated for viral infections. Pt will be discharged with symptomatic  treatment.  Discussed returning to ER with new or worsening symptoms. Verbalizes understanding and is agreeable with plan. Pt is hemodynamically stable & in NAD prior to dc.  Final Clinical Impressions(s) / ED Diagnoses   Final diagnoses:  Nausea vomiting and diarrhea  Viral URI with cough    ED Discharge Orders         Ordered    benzonatate (TESSALON) 100 MG capsule  Every 8 hours     11/14/18 1153    fluticasone (FLONASE) 50 MCG/ACT nasal spray  Daily     11/14/18 [address] [place], [place] 11/14/18 1157    Eber Hong, MD 11/16/18 1050

## 2019-12-28 ENCOUNTER — Encounter (HOSPITAL_COMMUNITY): Payer: Self-pay

## 2019-12-28 ENCOUNTER — Other Ambulatory Visit: Payer: Self-pay

## 2019-12-28 ENCOUNTER — Emergency Department (HOSPITAL_COMMUNITY)
Admission: EM | Admit: 2019-12-28 | Discharge: 2019-12-28 | Disposition: A | Discharge status: Home / Self Care | Payer: Self-pay | Attending: Emergency Medicine | Admitting: Emergency Medicine

## 2019-12-28 DIAGNOSIS — F17210 Nicotine dependence, cigarettes, uncomplicated: Secondary | ICD-10-CM | POA: Insufficient documentation

## 2019-12-28 DIAGNOSIS — J45909 Unspecified asthma, uncomplicated: Secondary | ICD-10-CM | POA: Insufficient documentation

## 2019-12-28 DIAGNOSIS — Z79899 Other long term (current) drug therapy: Secondary | ICD-10-CM | POA: Insufficient documentation

## 2019-12-28 DIAGNOSIS — M545 Low back pain, unspecified: Secondary | ICD-10-CM

## 2019-12-28 LAB — URINALYSIS, ROUTINE W REFLEX MICROSCOPIC
Bilirubin Urine: NEGATIVE
Glucose, UA: NEGATIVE mg/dL
Hgb urine dipstick: NEGATIVE
Ketones, ur: NEGATIVE mg/dL
Nitrite: NEGATIVE
Protein, ur: NEGATIVE mg/dL
Specific Gravity, Urine: 1.024 (ref 1.005–1.030)
pH: 6 (ref 5.0–8.0)

## 2019-12-28 LAB — PREGNANCY, URINE: Preg Test, Ur: NEGATIVE

## 2019-12-28 MED ORDER — CYCLOBENZAPRINE HCL 10 MG PO TABS: 10.0000 mg | ORAL_TABLET | Freq: Two times a day (BID) | ORAL | 0 refills | Status: DC | PRN

## 2019-12-28 MED ORDER — IBUPROFEN 600 MG PO TABS: 600.0000 mg | ORAL_TABLET | Freq: Four times a day (QID) | ORAL | 0 refills | Status: DC | PRN

## 2019-12-28 MED ORDER — PREDNISONE 20 MG PO TABS
60.0000 mg | ORAL_TABLET | Freq: Once | ORAL | Status: AC
  Administered 2019-12-28: 60 mg via ORAL
  Filled 2019-12-28: qty 3

## 2019-12-28 NOTE — ED Triage Notes (Signed)
Pt reports left lower back pain that now radiates to her hip ongoing for a couple weeks. Denies recent fall or injury to area. Pt ambulatory.

## 2019-12-28 NOTE — ED Provider Notes (Signed)
[facility] EMERGENCY DEPARTMENT Provider Note   CSN: [id number] Arrival date & time: 12/28/19  0623     History Chief Complaint  Patient presents with  . Back Pain  . Hip Pain    Sylvia Mccann is a 29 y.o. female.  The history is provided by the patient and medical records. No language interpreter was used.  Back Pain Hip Pain     29 year old female with history of depression, migraine, asthma presenting for evaluation of radicular leg pain patient report for the past month she has had recurrent pain primarily started in her low back radiates to her left buttock.  Pain initially mild but now has intensified, last night she was having difficulty sleeping due to her pain.  Rates her pain currently is 10 out of 10 worse when she lays down to improve when she stands.  No associated fever chills no abdominal pain no hematuria no bowel bladder incontinence or saddle anesthesia.  Denies any leg weakness or numbness.  Pain never radiates down to her foot.  She denies any injury.  She tries over-the-counter medication without adequate relief.  She is currently not pregnant.  No history of IV drug use or active cancer.  Past Medical History:  Diagnosis Date  . Asthma   . Migraine     Patient Active Problem List   Diagnosis Date Noted  . 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 NO PAST SURGERIES       OB History   No obstetric history on file.     No family history on file.  Social History   Tobacco Use  . Smoking status: Current Every Day Smoker    Packs/day: 0.50    Types: Cigarettes  . Smokeless tobacco: Never Used  Substance Use Topics  . Alcohol use: No  . Drug use: No    Home Medications Prior to Admission medications   Medication Sig Start Date  End Date Taking? Authorizing Provider  albuterol (PROVENTIL HFA;VENTOLIN HFA) 108 (90 Base) MCG/ACT inhaler Inhale 1-2 puffs into the lungs every 6 (six) hours as needed for wheezing or shortness of breath.    [provider]  benzonatate (TESSALON) 100 MG capsule Take 1 capsule (100 mg total) by mouth every 8 (eight) hours. 11/14/18   Carlyle Basques P, PA-C  doxycycline (VIBRAMYCIN) 100 MG capsule Take 1 capsule (100 mg total) by mouth 2 (two) times daily. One po bid x 7 days 05/06/18   Zadie Rhine, MD  fluticasone [facility]) 50 MCG/ACT nasal spray Place 1 spray into both nostrils daily. 11/14/18   Carlyle Basques P, PA-C  nortriptyline (PAMELOR) 25 MG capsule Take 50 mg by mouth at bedtime.    [provider]  predniSONE (DELTASONE) 50 MG tablet One tab PO daily for 4 days 05/06/18   Zadie Rhine, MD    Allergies    Diclofenac potassium(migraine)  Review of Systems   Review of Systems  Musculoskeletal: Positive for back pain.  All other systems reviewed and are negative.   Physical Exam Updated Vital Signs BP 127/85 (BP Location: Left Arm)   Pulse 94   Temp 98.6 F (37 C) (Oral)   Resp 17   Ht 5\' 5"  (1.651 m)   Wt 127 kg   LMP 12/20/2019   SpO2 99%   BMI 46.59  kg/m   Physical Exam Vitals and nursing note reviewed.  Constitutional:      General: She is not in acute distress.    Appearance: She is well-developed. She is obese.  HENT:     Head: Atraumatic.  Eyes:     Conjunctiva/sclera: Conjunctivae normal.  Abdominal:     Palpations: Abdomen is soft.     Tenderness: There is no abdominal tenderness.  Musculoskeletal:        General: Tenderness (Tenderness to lumbar paraspinal muscle, point tenderness to left lumbosacral region with normal straight leg raise.  Normal hip flexion extension abduction abduction.  Normal gait.) present.     Cervical back: Neck supple.  Skin:    Capillary Refill: Capillary refill takes less than 2 seconds.     Findings:  No rash.  Neurological:     General: No focal deficit present.     Mental Status: She is alert.     ED Results / Procedures / Treatments   Labs (all labs ordered are listed, but only abnormal results are displayed) Labs Reviewed  URINALYSIS, ROUTINE W REFLEX MICROSCOPIC - Abnormal; Notable for the following components:      Result Value   APPearance HAZY (*)    Leukocytes,Ua SMALL (*)    Bacteria, UA RARE (*)    All other components within normal limits  PREGNANCY, URINE  POC URINE PREG, ED    EKG None  Radiology No results found.  Procedures Procedures (including critical care time)  Medications Ordered in ED Medications  predniSONE (DELTASONE) tablet 60 mg (has no administration in time range)    ED Course  I have reviewed the triage vital signs and the nursing notes.  Pertinent labs & imaging results that were available during my care of the patient were reviewed by me and considered in my medical decision making (see chart for details).    MDM Rules/Calculators/A&P                      BP 127/85 (BP Location: Left Arm)   Pulse 94   Temp 98.6 F (37 C) (Oral)   Resp 17   Ht 5\' 5"  (1.651 m)   Wt 127 kg   LMP 12/20/2019   SpO2 99%   BMI 46.59 kg/m   Final Clinical Impression(s) / ED Diagnoses Final diagnoses:  Lumbosacral pain    Rx / DC Orders ED Discharge Orders    None     9:26 AM Patient here with pain to her left lumbosacral region, likely lumbosacral muscle skeletal strain.  Less likely sciatica.  No red flags.  Ambulate without difficulty.  No recent injury requiring advanced imaging at this time.  Will provide symptomatic treatment, will give referral to orthopedic for outpatient follow-up as needed.   Domenic Moras, PA-C 12/28/19 9937    Davonna Belling, MD 12/28/19 [phone]

## 2020-03-02 ENCOUNTER — Other Ambulatory Visit: Payer: Self-pay

## 2020-03-02 ENCOUNTER — Encounter (HOSPITAL_COMMUNITY): Payer: Self-pay | Admitting: Emergency Medicine

## 2020-03-02 ENCOUNTER — Telehealth: Payer: Self-pay

## 2020-03-02 ENCOUNTER — Emergency Department (HOSPITAL_COMMUNITY)
Admission: EM | Admit: 2020-03-02 | Discharge: 2020-03-02 | Disposition: A | Discharge status: Home / Self Care | Payer: Self-pay | Attending: Emergency Medicine | Admitting: Emergency Medicine

## 2020-03-02 DIAGNOSIS — Z79899 Other long term (current) drug therapy: Secondary | ICD-10-CM | POA: Insufficient documentation

## 2020-03-02 DIAGNOSIS — N730 Acute parametritis and pelvic cellulitis: Secondary | ICD-10-CM | POA: Insufficient documentation

## 2020-03-02 DIAGNOSIS — J45909 Unspecified asthma, uncomplicated: Secondary | ICD-10-CM | POA: Insufficient documentation

## 2020-03-02 DIAGNOSIS — F17210 Nicotine dependence, cigarettes, uncomplicated: Secondary | ICD-10-CM | POA: Insufficient documentation

## 2020-03-02 LAB — HIV ANTIBODY (ROUTINE TESTING W REFLEX): HIV Screen 4th Generation wRfx: NONREACTIVE

## 2020-03-02 LAB — URINALYSIS, ROUTINE W REFLEX MICROSCOPIC
Bilirubin Urine: NEGATIVE
Glucose, UA: NEGATIVE mg/dL
Hgb urine dipstick: NEGATIVE
Ketones, ur: NEGATIVE mg/dL
Leukocytes,Ua: NEGATIVE
Nitrite: NEGATIVE
Protein, ur: NEGATIVE mg/dL
Specific Gravity, Urine: 1.023 (ref 1.005–1.030)
pH: 6 (ref 5.0–8.0)

## 2020-03-02 LAB — RPR: RPR Ser Ql: NONREACTIVE

## 2020-03-02 LAB — PREGNANCY, URINE: Preg Test, Ur: NEGATIVE

## 2020-03-02 MED ORDER — CEFTRIAXONE SODIUM 500 MG IJ SOLR
500.0000 mg | Freq: Once | INTRAMUSCULAR | Status: AC
  Administered 2020-03-02: 500 mg via INTRAMUSCULAR
  Filled 2020-03-02: qty 500

## 2020-03-02 MED ORDER — DOXYCYCLINE HYCLATE 100 MG PO TABS
100.0000 mg | ORAL_TABLET | Freq: Once | ORAL | Status: AC
  Administered 2020-03-02: 100 mg via ORAL
  Filled 2020-03-02: qty 1

## 2020-03-02 MED ORDER — DOXYCYCLINE HYCLATE 100 MG PO TABS
100.0000 mg | ORAL_TABLET | Freq: Two times a day (BID) | ORAL | 0 refills | Status: DC
Start: 2020-03-02 — End: 2023-06-07

## 2020-03-02 MED ORDER — LIDOCAINE HCL (PF) 1 % IJ SOLN
5.0000 mL | Freq: Once | INTRAMUSCULAR | Status: AC
  Administered 2020-03-02: 5 mL
  Filled 2020-03-02: qty 5

## 2020-03-02 MED ORDER — NAPROXEN 500 MG PO TABS
500.0000 mg | ORAL_TABLET | Freq: Two times a day (BID) | ORAL | 0 refills | Status: DC
Start: 2020-03-02 — End: 2023-06-07

## 2020-03-02 NOTE — Telephone Encounter (Signed)
MATCH done for patient as she could not afford medicines, not covered by medicaid. Originally called in to [employer], but had to change to [employer] due to [employer] not taking MATCH. Faxed MATCH to [facility].[phone] #[phone] [email or web address] from ED . Called patient back to confirm that medications were transferred. She is waiting for scripts and stated they were transferred over. She will call back if any issue.

## 2020-03-02 NOTE — ED Provider Notes (Signed)
[facility] EMERGENCY DEPARTMENT Provider Note   CSN: [id number] Arrival date & time: 03/02/20  1696     History Chief Complaint  Patient presents with  . Vaginal Pain    Sylvia Mccann is a 29 y.o. female.  HPI   Patient presents the emergency room for evaluation of vaginal pain.  Patient states she first noticed symptoms 2 days ago during intercourse.  She started having pain in her vaginal area.  She felt the pain radiated into her rectum and she had to stop.  This morning the same thing occurred.  She started having the same pain in her vaginal and rectal area.  Also was in her lower abdomen.  Right now the patient is not having any pain.  She denies any trouble with vaginal discharge or bleeding.  No constipation or rectal bleeding.  Past Medical History:  Diagnosis Date  . Asthma   . Migraine     Patient Active Problem List   Diagnosis Date Noted  . Major depressive disorder, single episode, severe without psychotic features ([place]) 10/22/2017  . Major depressive disorder, single episode, severe without psychosis ([place]) 10/22/2017    Past Surgical History:  Procedure Laterality Date  . NO PAST SURGERIES       OB History   No obstetric history on file.     No family history on file.  Social History   Tobacco Use  . Smoking status: Current Every Day Smoker    Packs/day: 0.50    Types: Cigarettes  . Smokeless tobacco: Never Used  Substance Use Topics  . Alcohol use: No  . Drug use: No    Home Medications Prior to Admission medications   Medication Sig Start Date End Date Taking? Authorizing Provider  albuterol (PROVENTIL HFA;VENTOLIN HFA) 108 (90 Base) MCG/ACT inhaler Inhale 1-2 puffs into the lungs every 6 (six) hours as needed for wheezing or shortness of breath.    [provider]  benzonatate (TESSALON) 100 MG capsule Take 1 capsule (100 mg total) by mouth every 8 (eight) hours. 11/14/18   Darlin Drop P, PA-C  cyclobenzaprine  (FLEXERIL) 10 MG tablet Take 1 tablet (10 mg total) by mouth 2 (two) times daily as needed for muscle spasms. 12/28/19   Domenic Moras, PA-C  doxycycline (VIBRA-TABS) 100 MG tablet Take 1 tablet (100 mg total) by mouth 2 (two) times daily. 03/02/20   Dorie Rank, MD  fluticasone (FLONASE) 50 MCG/ACT nasal spray Place 1 spray into both nostrils daily. 11/14/18   Darlin Drop P, PA-C  ibuprofen (ADVIL) 600 MG tablet Take 1 tablet (600 mg total) by mouth every 6 (six) hours as needed. 12/28/19   Domenic Moras, PA-C  naproxen (NAPROSYN) 500 MG tablet Take 1 tablet (500 mg total) by mouth 2 (two) times daily with a meal. As needed for pain 03/02/20   Dorie Rank, MD  nortriptyline (PAMELOR) 25 MG capsule Take 50 mg by mouth at bedtime.    [provider]  predniSONE (DELTASONE) 50 MG tablet One tab PO daily for 4 days 05/06/18   Ripley Fraise, MD    Allergies    Diclofenac potassium(migraine)  Review of Systems   Review of Systems  All other systems reviewed and are negative.   Physical Exam Updated Vital Signs BP 126/87 (BP Location: Right Arm)   Pulse 86   Temp 98.3 F (36.8 C) (Oral)   Resp 16   Ht 1.651 m (5\' 5" )   Wt 127 kg  LMP 01/17/2020   SpO2 97%   BMI 46.59 kg/m   Physical Exam Vitals and nursing note reviewed. Exam conducted with a chaperone present.  Constitutional:      General: She is not in acute distress.    Appearance: She is well-developed.  HENT:     Head: Normocephalic and atraumatic.     Right Ear: External ear normal.     Left Ear: External ear normal.  Eyes:     General: No scleral icterus.       Right eye: No discharge.        Left eye: No discharge.     Conjunctiva/sclera: Conjunctivae normal.  Neck:     Trachea: No tracheal deviation.  Cardiovascular:     Rate and Rhythm: Normal rate and regular rhythm.  Pulmonary:     Effort: Pulmonary effort is normal. No respiratory distress.     Breath sounds: Normal breath sounds. No stridor. No wheezing or  rales.  Abdominal:     General: Bowel sounds are normal. There is no distension.     Palpations: Abdomen is soft.     Tenderness: There is no abdominal tenderness. There is no guarding or rebound.  Genitourinary:    General: Normal vulva.     Vagina: Vaginal discharge present.     Cervix: Cervical motion tenderness and discharge present. No lesion, erythema or cervical bleeding.     Uterus: Tender.      Adnexa:        Right: No mass or tenderness.         Left: No mass or tenderness.    Musculoskeletal:        General: No tenderness.     Cervical back: Neck supple.  Skin:    General: Skin is warm and dry.     Findings: No rash.  Neurological:     Mental Status: She is alert.     Cranial Nerves: No cranial nerve deficit (no facial droop, extraocular movements intact, no slurred speech).     Sensory: No sensory deficit.     Motor: No abnormal muscle tone or seizure activity.     Coordination: Coordination normal.     ED Results / Procedures / Treatments   Labs (all labs ordered are listed, but only abnormal results are displayed) Labs Reviewed  WET PREP, GENITAL  URINALYSIS, ROUTINE W REFLEX MICROSCOPIC  HIV ANTIBODY (ROUTINE TESTING W REFLEX)  PREGNANCY, URINE  RPR  GC/CHLAMYDIA PROBE AMP ([place]) NOT AT [facility]    EKG None  Radiology No results found.  Procedures Procedures (including critical care time)  Medications Ordered in ED Medications  cefTRIAXone (ROCEPHIN) injection 500 mg (has no administration in time range)  doxycycline (VIBRA-TABS) tablet 100 mg (has no administration in time range)  lidocaine (PF) (XYLOCAINE) 1 % injection 5 mL (has no administration in time range)    ED Course  I have reviewed the triage vital signs and the nursing notes.  Pertinent labs & imaging results that were available during my care of the patient were reviewed by me and considered in my medical decision making (see chart for details).    MDM  Rules/Calculators/A&P                      Sx concerning for cervicitis, PID.  No abdominal pain.  Doubt appendicitis, TOA, ovarian torsion..  No UTI and preg negative.  Will start on abx.  Dc home on oral abx.  Outpt  gyn follow up Final Clinical Impression(s) / ED Diagnoses Final diagnoses:  PID (acute pelvic inflammatory disease)    Rx / DC Orders ED Discharge Orders         Ordered    doxycycline (VIBRA-TABS) 100 MG tablet  2 times daily     03/02/20 0916    naproxen (NAPROSYN) 500 MG tablet  2 times daily with meals     03/02/20 0916           Linwood Dibbles, MD 03/02/20 1004

## 2020-03-02 NOTE — ED Triage Notes (Signed)
Pt c/o vaginal pain radiating to rectum and lower abdomen, pt reports pain began 2 days ago during intercourse, pain became worse with intercourse this am. Denies bleeding or discharge.

## 2020-03-02 NOTE — Discharge Instructions (Addendum)
Take the abx as prescribed,  follow up with a GYN doctor for further evaluation

## 2020-03-04 LAB — GC/CHLAMYDIA PROBE AMP (~~LOC~~) NOT AT ARMC
Chlamydia: NEGATIVE
Comment: NEGATIVE
Comment: NORMAL
Neisseria Gonorrhea: NEGATIVE

## 2020-03-19 ENCOUNTER — Encounter: Payer: Medicaid Other | Admitting: Nurse Practitioner

## 2020-04-18 ENCOUNTER — Encounter: Payer: Self-pay | Admitting: Obstetrics & Gynecology

## 2020-04-18 ENCOUNTER — Other Ambulatory Visit (HOSPITAL_COMMUNITY)
Admission: RE | Admit: 2020-04-18 | Discharge: 2020-04-18 | Disposition: A | Discharge status: Home / Self Care | Payer: Medicaid Other | Source: Ambulatory Visit | Attending: Obstetrics & Gynecology | Admitting: Obstetrics & Gynecology

## 2020-04-18 ENCOUNTER — Other Ambulatory Visit: Payer: Self-pay

## 2020-04-18 ENCOUNTER — Ambulatory Visit (INDEPENDENT_AMBULATORY_CARE_PROVIDER_SITE_OTHER): Payer: Self-pay | Admitting: Obstetrics & Gynecology

## 2020-04-18 VITALS — BP 113/75 | HR 105 | Wt 233.0 lb

## 2020-04-18 DIAGNOSIS — N921 Excessive and frequent menstruation with irregular cycle: Secondary | ICD-10-CM | POA: Insufficient documentation

## 2020-04-18 DIAGNOSIS — Z01419 Encounter for gynecological examination (general) (routine) without abnormal findings: Secondary | ICD-10-CM | POA: Diagnosis not present

## 2020-04-18 DIAGNOSIS — N926 Irregular menstruation, unspecified: Secondary | ICD-10-CM | POA: Diagnosis present

## 2020-04-18 DIAGNOSIS — R102 Pelvic and perineal pain: Secondary | ICD-10-CM | POA: Diagnosis not present

## 2020-04-18 MED ORDER — TRANEXAMIC ACID 650 MG PO TABS: 1300.0000 mg | ORAL_TABLET | Freq: Three times a day (TID) | ORAL | 3 refills | Status: DC

## 2020-04-18 NOTE — Progress Notes (Signed)
Sylvia Mccann is an 29 y.o. female. G0, here for evaluation of pelvic pain worsened over the past few years. Metrorrhagia over the past few years as well. Only contraception was depo from 16-19.  Pt presented to ED and was treated for suspected PID. She denies h/o STDs, does have pain with movement, intercourse.  She and her partner have been trying to conceive for 6- 8 months.  Denies dyschezia, female pattern hair growth.   Pertinent Gynecological History: Menses: regular every 3-5 days without intermenstrual spotting and with severe dysmenorrhea Bleeding: intermenstrual bleeding Contraception: none DES exposure: denies Blood transfusions: none Sexually transmitted diseases: no past history and currently at risk Previous GYN Procedures: none  Last mammogram: N/A  Last pap: normal Date: 2018 OB History: G0,   Menstrual History: Menarche age: 22 Patient's last menstrual period was 04/13/2020.    Past Medical History:  Diagnosis Date  . Asthma   . Migraine     Past Surgical History:  Procedure Laterality Date  . NO PAST SURGERIES      No family history on file.  Social History:  reports that she has been smoking cigarettes. She has been smoking about 0.50 packs per day. She has never used smokeless tobacco. She reports that she does not drink alcohol and does not use drugs.  Allergies:  Allergies  Allergen Reactions  . Diclofenac Potassium(Migraine) Other (See Comments)    'MADE ME FEEL LIKE MY HEAD WAS ON FIRE"    (Not in a hospital admission)   Review of Systems  Constitutional: Negative for activity change, appetite change, fatigue and unexpected weight change.  HENT: Negative.   Eyes: Negative.   Respiratory: Negative.  Negative for chest tightness, shortness of breath and wheezing.   Cardiovascular: Negative.  Negative for chest pain and leg swelling.  Gastrointestinal: Positive for abdominal pain. Negative for abdominal distention, constipation, diarrhea, nausea  and vomiting.  Endocrine: Negative.   Genitourinary: Positive for pelvic pain. Negative for dysuria and hematuria.  Neurological: Negative.  Negative for dizziness, weakness, light-headedness, numbness and headaches.  Hematological: Negative.   Psychiatric/Behavioral: Negative.  Negative for agitation, confusion and decreased concentration.    Blood pressure 113/75, pulse (!) 105, weight 233 lb (105.7 kg), last menstrual period 04/13/2020. Physical Exam  Vitals reviewed. Constitutional: She is oriented to person, place, and time. She appears well-developed.  HENT:  Head: Normocephalic and atraumatic.  Eyes: Pupils are equal, round, and reactive to light.  Cardiovascular: Normal rate.  Respiratory: Effort normal.  GI: Soft. She exhibits no distension. There is no abdominal tenderness. There is no guarding.  Genitourinary:    Vulva normal.     No vaginal discharge.   Musculoskeletal:     Cervical back: Normal range of motion.  Neurological: She is alert and oriented to person, place, and time.  Skin: Skin is warm and dry.  Psychiatric: Her behavior is normal.    No results found for this or any previous visit (from the past 24 hour(s)).  No results found.  Assessment/Plan: Dysmenorrhea/ Metrrohagia: H/o migraines improved with tragus ear piercing, migraines w/ aura, not a candidate for OCPs.  Pt currently declines contraception. Will try 2-3 month course of lysteda for dysmenorrhea. TVUSG ordered as well. Pap done today now up to date.  Precautions given, f/u in 3 months, prn concerns.   Malachy Chamber 04/18/2020, 9:52 AM

## 2020-04-18 NOTE — Patient Instructions (Signed)
Tranexamic acid oral tablets What is this medicine? TRANEXAMIC ACID (TRAN ex AM ik AS id) slows down or stops blood clots from being broken down. This medicine is used to treat heavy monthly menstrual bleeding. This medicine may be used for other purposes; ask your health care provider or pharmacist if you have questions. COMMON BRAND NAME(S): Cyklokapron, Lysteda What should I tell my health care provider before I take this medicine? They need to know if you have any of these conditions:  bleeding in the brain  blood clotting problems  kidney disease  vision problems  an unusual allergic reaction to tranexamic acid, other medicines, foods, dyes, or preservatives  pregnant or trying to get pregnant  breast-feeding How should I use this medicine? Take this medicine by mouth with a glass of water. Follow the directions on the prescription label. Do not cut, crush, or chew this medicine. You can take it with or without food. If it upsets your stomach, take it with food. Take your medicine at regular intervals. Do not take it more often than directed. Do not stop taking except on your doctor's advice. Do not take this medicine until your period has started. Do not take it for more than 5 days in a row. Do not take this medicine when you do not have your period. Talk to your pediatrician regarding the use of this medicine in children. While this drug may be prescribed for female children as young as 12 years of age for selected conditions, precautions do apply. Overdosage: If you think you have taken too much of this medicine contact a poison control center or emergency room at once. NOTE: This medicine is only for you. Do not share this medicine with others. What if I miss a dose? If you miss a dose, take it when you remember, and then take your next dose at least 6 hours later. Do not take more than 2 tablets at a time to make up for missed doses. What may interact with this medicine? Do  not take this medicine with any of the following medications:  estrogens  birth control pills, patches, injections, rings or other devices that contain both an estrogen and a progestin This medicine may also interact with the following medications:  certain medicines used to help your blood clot  tretinoin (taken by mouth) This list may not describe all possible interactions. Give your health care provider a list of all the medicines, herbs, non-prescription drugs, or dietary supplements you use. Also tell them if you smoke, drink alcohol, or use illegal drugs. Some items may interact with your medicine. What should I watch for while using this medicine? Tell your doctor or healthcare professional if your symptoms do not start to get better or if they get worse. Tell your doctor or healthcare professional if you notice any eye problems while taking this medicine. Your doctor will refer you to an eye doctor who will examine your eyes. What side effects may I notice from receiving this medicine? Side effects that you should report to your doctor or health care professional as soon as possible:  allergic reactions like skin rash, itching or hives, swelling of the face, lips, or tongue  breathing difficulties  changes in vision  sudden or severe pain in the chest, legs, head, or groin  unusually weak or tired Side effects that usually do not require medical attention (report to your doctor or health care professional if they continue or are bothersome):  back pain    headache  muscle or joint aches  sinus and nasal problems  stomach pain  tiredness This list may not describe all possible side effects. Call your doctor for medical advice about side effects. You may report side effects to FDA at 1-800-FDA-1088. Where should I keep my medicine? Keep out of the reach of children. Store at room temperature between 15 and 30 degrees C (59 and 86 degrees F). Throw away any unused  medicine after the expiration date. NOTE: This sheet is a summary. It may not cover all possible information. If you have questions about this medicine, talk to your doctor, pharmacist, or health care provider.  2020 Elsevier/Gold Standard (2015-11-14 09:12:15)  

## 2020-04-19 LAB — CYTOLOGY - PAP
Comment: NEGATIVE
Diagnosis: NEGATIVE
High risk HPV: NEGATIVE

## 2020-04-24 ENCOUNTER — Ambulatory Visit
Admission: RE | Admit: 2020-04-24 | Discharge: 2020-04-24 | Disposition: A | Discharge status: Home / Self Care | Payer: Medicaid Other | Source: Ambulatory Visit | Attending: Obstetrics & Gynecology | Admitting: Obstetrics & Gynecology

## 2020-04-24 ENCOUNTER — Other Ambulatory Visit: Payer: Self-pay

## 2020-04-24 DIAGNOSIS — N926 Irregular menstruation, unspecified: Secondary | ICD-10-CM

## 2021-02-06 ENCOUNTER — Emergency Department (HOSPITAL_COMMUNITY)
Admission: EM | Admit: 2021-02-06 | Discharge: 2021-02-06 | Disposition: A | Discharge status: Home / Self Care | Payer: Medicaid Other | Attending: Physician Assistant | Admitting: Physician Assistant

## 2021-02-06 ENCOUNTER — Encounter (HOSPITAL_COMMUNITY): Payer: Self-pay | Admitting: Emergency Medicine

## 2021-02-06 ENCOUNTER — Emergency Department (HOSPITAL_COMMUNITY): Payer: Medicaid Other

## 2021-02-06 ENCOUNTER — Other Ambulatory Visit: Payer: Self-pay

## 2021-02-06 DIAGNOSIS — S9001XA Contusion of right ankle, initial encounter: Secondary | ICD-10-CM | POA: Insufficient documentation

## 2021-02-06 DIAGNOSIS — Z5321 Procedure and treatment not carried out due to patient leaving prior to being seen by health care provider: Secondary | ICD-10-CM | POA: Insufficient documentation

## 2021-02-06 DIAGNOSIS — W19XXXA Unspecified fall, initial encounter: Secondary | ICD-10-CM | POA: Insufficient documentation

## 2021-02-06 DIAGNOSIS — S9031XA Contusion of right foot, initial encounter: Secondary | ICD-10-CM | POA: Insufficient documentation

## 2021-02-06 NOTE — ED Notes (Signed)
Pt stated she wasn't waiting any longer x30 mins ago. Has not been seen since.

## 2021-02-06 NOTE — ED Triage Notes (Signed)
Pt c/o right foot/ankle pain, swelling and bruising after falling last night.

## 2021-02-06 NOTE — ED Triage Notes (Signed)
Emergency Medicine Provider Triage Evaluation Note  Sylvia Mccann , a 30 y.o. female  was evaluated in triage.  Pt complains of right foot pain after a fall..  Review of Systems  Positive: Right foot and ankle pain, swelling Negative: Numbness, tingling   Physical Exam  BP 124/72 (BP Location: Right Arm)   Pulse 72   Temp 98.8 F (37.1 C) (Oral)   Resp 18   SpO2 100%  Gen:   Awake, no distress   HEENT:  Atraumatic  Resp:  Normal effort  Cardiac:  Normal rate  Abd:   Nondistended, nontender  MSK:   Right foot and ankle with 2+ edema, sensations intact Neuro:  Speech clear   Medical Decision Making  Medically screening exam initiated at 4:36 PM.  Appropriate orders placed.  Jennea Rager was informed that the remainder of the evaluation will be completed by another provider, this initial triage assessment does not replace that evaluation, and the importance of remaining in the ED until their evaluation is complete.  Clinical Impression  Patient with right foot injury and pain.  Visible swelling.  Will order x-rays of foot and ankle.   Mare Ferrari, PA-C 02/06/21 1638

## 2021-02-07 ENCOUNTER — Encounter (HOSPITAL_COMMUNITY): Payer: Self-pay

## 2021-02-07 ENCOUNTER — Other Ambulatory Visit: Payer: Self-pay

## 2021-02-07 ENCOUNTER — Ambulatory Visit (HOSPITAL_COMMUNITY)
Admission: EM | Admit: 2021-02-07 | Discharge: 2021-02-07 | Disposition: A | Discharge status: Home / Self Care | Payer: Medicaid Other | Attending: Urgent Care | Admitting: Urgent Care

## 2021-02-07 DIAGNOSIS — S82891A Other fracture of right lower leg, initial encounter for closed fracture: Secondary | ICD-10-CM

## 2021-02-07 DIAGNOSIS — M25571 Pain in right ankle and joints of right foot: Secondary | ICD-10-CM

## 2021-02-07 MED ORDER — HYDROCODONE-ACETAMINOPHEN 5-325 MG PO TABS: 1.0000 | ORAL_TABLET | Freq: Four times a day (QID) | ORAL | 0 refills | Status: DC | PRN

## 2021-02-07 NOTE — ED Provider Notes (Signed)
[facility]   MRN: [id number] DOB: 04/01/1991  Subjective:   Sylvia Mccann is a 30 y.o. female presenting for 2-day history of persistent and worsening right ankle pain with swelling.  Suffered a fall and rolled her foot laterally.  Patient states that she went to the emergency room yesterday, left without being seen because she did not like waiting and the service.  She does not know her x-ray results from yesterday's imaging from the ER.  No current facility-administered medications for this encounter.  Current Outpatient Medications:  .  albuterol (PROVENTIL HFA;VENTOLIN HFA) 108 (90 Base) MCG/ACT inhaler, Inhale 1-2 puffs into the lungs every 6 (six) hours as needed for wheezing or shortness of breath. (Patient not taking: Reported on 04/18/2020), Disp: , Rfl:  .  benzonatate (TESSALON) 100 MG capsule, Take 1 capsule (100 mg total) by mouth every 8 (eight) hours. (Patient not taking: Reported on 04/18/2020), Disp: 21 capsule, Rfl: 0 .  cyclobenzaprine (FLEXERIL) 10 MG tablet, Take 1 tablet (10 mg total) by mouth 2 (two) times daily as needed for muscle spasms. (Patient not taking: Reported on 04/18/2020), Disp: 20 tablet, Rfl: 0 .  doxycycline (VIBRA-TABS) 100 MG tablet, Take 1 tablet (100 mg total) by mouth 2 (two) times daily. (Patient not taking: Reported on 04/18/2020), Disp: 14 tablet, Rfl: 0 .  fluticasone (FLONASE) 50 MCG/ACT nasal spray, Place 1 spray into both nostrils daily. (Patient not taking: Reported on 04/18/2020), Disp: 9.9 g, Rfl: 0 .  ibuprofen (ADVIL) 600 MG tablet, Take 1 tablet (600 mg total) by mouth every 6 (six) hours as needed. (Patient not taking: Reported on 04/18/2020), Disp: 30 tablet, Rfl: 0 .  naproxen (NAPROSYN) 500 MG tablet, Take 1 tablet (500 mg total) by mouth 2 (two) times daily with a meal. As needed for pain (Patient not taking: Reported on 04/18/2020), Disp: 20 tablet, Rfl: 0 .  nortriptyline (PAMELOR) 25 MG capsule, Take 50 mg by mouth at  bedtime. (Patient not taking: Reported on 04/18/2020), Disp: , Rfl:  .  predniSONE (DELTASONE) 50 MG tablet, One tab PO daily for 4 days (Patient not taking: Reported on 04/18/2020), Disp: 4 tablet, Rfl: 0 .  tranexamic acid (LYSTEDA) 650 MG TABS tablet, Take 2 tablets (1,300 mg total) by mouth 3 (three) times daily., Disp: 30 tablet, Rfl: 3   Allergies  Allergen Reactions  . Diclofenac Potassium(Migraine) Other (See Comments)    'MADE ME FEEL LIKE MY HEAD WAS ON FIRE"    Past Medical History:  Diagnosis Date  . Asthma   . Migraine      Past Surgical History:  Procedure Laterality Date  . NO PAST SURGERIES      History reviewed. No pertinent family history.  Social History   Tobacco Use  . Smoking status: Current Every Day Smoker    Packs/day: 0.50    Types: Cigarettes  . Smokeless tobacco: Never Used  Substance Use Topics  . Alcohol use: No  . Drug use: No    ROS   Objective:   Vitals: BP 116/78   Pulse 64   Temp 98.3 F (36.8 C)   Resp 19   LMP 01/21/2021 (Approximate)   SpO2 99%   Physical Exam Constitutional:      General: She is not in acute distress.    Appearance: Normal appearance. She is well-developed. She is not ill-appearing, toxic-appearing or diaphoretic.  HENT:     Head: Normocephalic and atraumatic.     Nose: Nose  normal.     Mouth/Throat:     Mouth: Mucous membranes are moist.     Pharynx: Oropharynx is clear.  Eyes:     General: No scleral icterus.       Right eye: No discharge.        Left eye: No discharge.     Extraocular Movements: Extraocular movements intact.     Conjunctiva/sclera: Conjunctivae normal.     Pupils: Pupils are equal, round, and reactive to light.  Cardiovascular:     Rate and Rhythm: Normal rate.  Pulmonary:     Effort: Pulmonary effort is normal.  Musculoskeletal:     Right ankle: Swelling present. No deformity, ecchymosis or lacerations. Tenderness present over the lateral malleolus, ATF ligament, AITF  ligament and base of 5th metatarsal. No medial malleolus, CF ligament, posterior TF ligament or proximal fibula tenderness. Decreased range of motion.     Right Achilles Tendon: No tenderness or defects. Thompson's test negative.  Skin:    General: Skin is warm and dry.  Neurological:     General: No focal deficit present.     Mental Status: She is alert and oriented to person, place, and time.     Motor: No weakness.     Coordination: Coordination normal.     Gait: Gait normal.     Deep Tendon Reflexes: Reflexes normal.  Psychiatric:        Mood and Affect: Mood normal.        Behavior: Behavior normal.        Thought Content: Thought content normal.        Judgment: Judgment normal.     DG Ankle Complete Right  Result Date: 02/06/2021 CLINICAL DATA:  Fall last night EXAM: RIGHT ANKLE - COMPLETE 3+ VIEW COMPARISON:  None. FINDINGS: Fracture distal fibula with slight displacement. Widening the ankle mortise. Diffuse soft tissue swelling and joint effusion in the ankle. No fracture of the tibia. IMPRESSION: Fracture distal fibula with mild widening of the ankle joint space. Diffuse soft tissue swelling. Electronically Signed   By: Marlan Palau M.D.   On: 02/06/2021 17:17   DG Foot Complete Right  Result Date: 02/06/2021 CLINICAL DATA:  Fall last night.  Right foot pain EXAM: RIGHT FOOT COMPLETE - 3+ VIEW COMPARISON:  Right ankle 02/06/2021 FINDINGS: Oblique fracture distal right fibula as noted on ankle study. No foot fracture or arthropathy. Diffuse soft tissue swelling in the forefoot. IMPRESSION: Fracture distal fibula.  Soft tissue swelling in the forefoot. Electronically Signed   By: Marlan Palau M.D.   On: 02/06/2021 17:17    Assessment and Plan :   I have reviewed the PDMP during this encounter.  1. Acute right ankle pain   2. Closed fracture of right ankle, initial encounter     Reviewed x-ray report with patient.  She will be placed in a posterior and stirrup splint,  has to be nonweightbearing, ibuprofen for pain relief.  Patient refused prescription for naproxen.  Hydrocodone for breakthrough pain.  Follow-up with [facility] orthopedics, Dr. August Saucer, for further management of her ankle fracture. Counseled patient on potential for adverse effects with medications prescribed/recommended today, ER and return-to-clinic precautions discussed, patient verbalized understanding.    Wallis Bamberg, PA-C 02/07/21 1447

## 2021-02-07 NOTE — Discharge Instructions (Signed)
Please call Dr. Diamantina Providence office today for an appointment to manage your ankle fracture further. In the meantime, make sure you keep the splint on at all times, do not take it off. Use crutches to move around when you need to, not putting any weight on your right foot. You may take ibuprofen 800mg  every 8 hours with food for aches, pains. Use hydrocodone if ibuprofen is not controlling your pain. If you do not need hydrocodone then do not use it.

## 2021-02-07 NOTE — ED Triage Notes (Signed)
Pt in with c/o right foot/ankle pain that  Started on Wednesday after she fell   Pt states she has been taking ibuprofen with no relief  Swelling noted to right ankle and foot

## 2021-02-07 NOTE — Progress Notes (Signed)
Orthopedic Tech Progress Note Patient Details:  Pattiann Solanki 1990-11-25 [id number]  Ortho Devices Type of Ortho Device: Stirrup splint,Short leg splint Ortho Device/Splint Location: RLE Ortho Device/Splint Interventions: Application,Ordered   Post Interventions Patient Tolerated: Well Instructions Provided: Care of device,Poper ambulation with device   Channin Agustin A Antoinette Haskett 02/07/2021, 12:40 PM

## 2021-02-12 ENCOUNTER — Ambulatory Visit (INDEPENDENT_AMBULATORY_CARE_PROVIDER_SITE_OTHER): Payer: Self-pay

## 2021-02-12 ENCOUNTER — Ambulatory Visit (INDEPENDENT_AMBULATORY_CARE_PROVIDER_SITE_OTHER): Payer: Self-pay | Admitting: Orthopedic Surgery

## 2021-02-12 DIAGNOSIS — M25571 Pain in right ankle and joints of right foot: Secondary | ICD-10-CM

## 2021-02-12 MED ORDER — ASPIRIN EC 81 MG PO TBEC
81.0000 mg | DELAYED_RELEASE_TABLET | Freq: Every day | ORAL | 11 refills | Status: DC
Start: 2021-02-12 — End: 2023-06-07

## 2021-02-16 ENCOUNTER — Encounter: Payer: Self-pay | Admitting: Orthopedic Surgery

## 2021-02-16 NOTE — Progress Notes (Signed)
Office Visit Note   Patient: Sylvia Mccann           Date of Birth: Mar 08, 1991           MRN: [id number] Visit Date: 02/12/2021 Requested by: No referring provider defined for this encounter. PCP: Patient, No Pcp Per (Inactive)  Subjective: Chief Complaint  Patient presents with  . Right Ankle - Injury    HPI: Patient presents for evaluation of right ankle pain.  Date of injury 02/06/2021.  Patient rolled her ankle stepping off a curb.  She has been in the splint since that time which has been uncomfortable.  Taking Norco for pain as well as ibuprofen.  No personal or family history of DVT or pulmonary embolism.  No other orthopedic complaints.  Denies any medial sided tenderness to the right ankle              ROS: All systems reviewed are negative as they relate to the chief complaint within the history of present illness.  Patient denies  fevers or chills.   Assessment & Plan: Visit Diagnoses:  1. Pain in right ankle and joints of right foot     Plan: Impression is 2 mm displaced lateral malleolus fracture with no medial tenderness and no asymmetry of the mortise.  Plan is out of work x4 weeks.  She has a bit of an abrasion from the splint.  Aspirin 81 mg a day for 4 weeks for DVT prophylaxis.  Negative Homans and no tenderness in the calf today.  7-day return for repeat radiographs.  No cast today because of the skin issues.  Ace wrap plus fracture boot applied.  Nonweightbearing right lower extremity with no ankle range of motion until her return office visit.  Ulceration of the skin is dressed today as well.  Follow-Up Instructions: Return in about 1 week (around 02/19/2021).   Orders:  Orders Placed This Encounter  Procedures  . XR Ankle Complete Right   Meds ordered this encounter  Medications  . aspirin EC 81 MG tablet    Sig: Take 1 tablet (81 mg total) by mouth daily. Swallow whole.    Dispense:  30 tablet    Refill:  11      Procedures: No procedures  performed   Clinical Data: No additional findings.  Objective: Vital Signs: LMP 01/21/2021 (Approximate)   Physical Exam:   Constitutional: Patient appears well-developed HEENT:  Head: Normocephalic Eyes:EOM are normal Neck: Normal range of motion Cardiovascular: Normal rate Pulmonary/chest: Effort normal Neurologic: Patient is alert Skin: Skin is warm Psychiatric: Patient has normal mood and affect    Ortho Exam: Ortho exam demonstrates full active and passive range of motion of the right knee.  Ankle has lateral sided tenderness and mild swelling.  Negative Homans no calf tenderness to palpation.  Pedal pulses intact.  Sensation intact on the dorsal and palmar aspect of the foot.  No medial sided malleolar tenderness.  Specialty Comments:  No specialty comments available.  Imaging: No results found.   PMFS History: Patient Active Problem List   Diagnosis Date Noted  . 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 Major depressive disorder, single episode, severe without psychosis (HCC) 10/22/2017   Past Medical History:  Diagnosis Date  . Asthma   . Migraine     History reviewed. No pertinent family history.  Past Surgical History:  Procedure Laterality Date  . NO PAST SURGERIES     Social History  Occupational History  . Not on file  Tobacco Use  . Smoking status: Current Every Day Smoker    Packs/day: 0.50    Types: Cigarettes  . Smokeless tobacco: Never Used  Substance and Sexual Activity  . Alcohol use: No  . Drug use: No  . Sexual activity: Yes    Birth control/protection: None

## 2021-02-21 ENCOUNTER — Encounter: Payer: Self-pay | Admitting: Surgical

## 2021-02-21 ENCOUNTER — Ambulatory Visit (INDEPENDENT_AMBULATORY_CARE_PROVIDER_SITE_OTHER): Payer: Self-pay | Admitting: Surgical

## 2021-02-21 ENCOUNTER — Ambulatory Visit (INDEPENDENT_AMBULATORY_CARE_PROVIDER_SITE_OTHER): Payer: Self-pay

## 2021-02-21 DIAGNOSIS — M25571 Pain in right ankle and joints of right foot: Secondary | ICD-10-CM

## 2021-02-21 DIAGNOSIS — S82891A Other fracture of right lower leg, initial encounter for closed fracture: Secondary | ICD-10-CM

## 2021-02-21 NOTE — Progress Notes (Signed)
   Post-FractureVisit Note   Patient: Sylvia Mccann           Date of Birth: Nov 11, 1990           MRN: [id number] Visit Date: 02/21/2021 PCP: Patient, No Pcp Per (Inactive)   Assessment & Plan:  Chief Complaint:  Chief Complaint  Patient presents with  . Right Ankle - Pain, Follow-up   Visit Diagnoses:  1. Closed right ankle fracture, initial encounter   2. Pain in right ankle and joints of right foot     Plan: Patient is a 30 year old female who presents for reevaluation of right ankle fracture.  Date of injury was 02/06/2021.  She has been taking occasional Norco as well as ibuprofen 800 mg for pain control.  Pain is improving and she has been mostly maintaining nonweightbearing status though she has occasionally put some weight on the injured extremity.  She is currently ambulating with crutches in a fracture boot with soft dressing over lateral calf ulceration.  She is taking aspirin for DVT prophylaxis.  On exam she has tenderness over the lateral malleolus with swelling that is reduced compared with prior examination.  Syndesmosis is stable on exam.  She is able to dorsiflex and plantarflex the ankle.  No calf tenderness, negative Homans' sign.  Pedal pulses are palpable.  She has continued ulceration of the lateral calf that is weeping slightly but does not seem infected.  Radiographs taken today reveal lateral malleolus fracture with very minimal displacement compared with prior radiographs but still acceptable position for nonoperative treatment.  No significant widening of the ankle mortise and no interval fracture noted.  Patient does endorse a history of low vitamin D though no lab studies are available to correlate this history.  Plan to obtain vitamin D level today with possible supplementation to follow.  Instructed patient to continue nonweightbearing in the fracture boot with soft dressing over the ulceration.  Follow-up in 10 days for clinical recheck with x-ray at the time to  ensure no further displacement.  She understands that it is important not to put any weight on this ankle or there is risk of displacement to the point that she will have to have surgery.  She agreed with this plan, follow-up in 10 days with Dr. August Saucer.  Follow-Up Instructions: No follow-ups on file.   Orders:  Orders Placed This Encounter  Procedures  . XR Ankle Complete Right  . Vitamin D (25 hydroxy)   No orders of the defined types were placed in this encounter.   Imaging: No results found.  PMFS History: Patient Active Problem List   Diagnosis Date Noted  . 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 Major depressive disorder, single episode, severe without psychosis (HCC) 10/22/2017   Past Medical History:  Diagnosis Date  . Asthma   . Migraine     History reviewed. No pertinent family history.  Past Surgical History:  Procedure Laterality Date  . NO PAST SURGERIES     Social History   Occupational History  . Not on file  Tobacco Use  . Smoking status: Current Every Day Smoker    Packs/day: 0.50    Types: Cigarettes  . Smokeless tobacco: Never Used  Substance and Sexual Activity  . Alcohol use: No  . Drug use: No  . Sexual activity: Yes    Birth control/protection: None

## 2021-02-22 LAB — VITAMIN D 25 HYDROXY (VIT D DEFICIENCY, FRACTURES): Vit D, 25-Hydroxy: 22 ng/mL — ABNORMAL LOW (ref 30–100)

## 2021-02-24 ENCOUNTER — Other Ambulatory Visit: Payer: Self-pay | Admitting: Surgical

## 2021-02-24 MED ORDER — VITAMIN D (ERGOCALCIFEROL) 1.25 MG (50000 UNIT) PO CAPS: 50000.0000 [IU] | ORAL_CAPSULE | ORAL | 0 refills | Status: DC

## 2021-02-24 NOTE — Progress Notes (Signed)
Sent in Vit D and called

## 2021-03-07 ENCOUNTER — Ambulatory Visit (INDEPENDENT_AMBULATORY_CARE_PROVIDER_SITE_OTHER): Payer: Self-pay

## 2021-03-07 ENCOUNTER — Ambulatory Visit (INDEPENDENT_AMBULATORY_CARE_PROVIDER_SITE_OTHER): Payer: Self-pay | Admitting: Orthopedic Surgery

## 2021-03-07 DIAGNOSIS — S82891A Other fracture of right lower leg, initial encounter for closed fracture: Secondary | ICD-10-CM

## 2021-03-07 DIAGNOSIS — M25571 Pain in right ankle and joints of right foot: Secondary | ICD-10-CM

## 2021-03-08 ENCOUNTER — Encounter: Payer: Self-pay | Admitting: Orthopedic Surgery

## 2021-03-08 NOTE — Progress Notes (Signed)
   Post-Op Visit Note   Patient: Sylvia Mccann           Date of Birth: Jul 28, 1991           MRN: [id number] Visit Date: 03/07/2021 PCP: Patient, No Pcp Per (Inactive)   Assessment & Plan:  Chief Complaint:  Chief Complaint  Patient presents with  . Other     F/u right ankle fx DOI 02/06/21   Visit Diagnoses:  1. Pain in right ankle and joints of right foot   2. Closed right ankle fracture, initial encounter     Plan: Patient presents for evaluation of right ankle fracture.  Date of injury 02/06/2021.  She is feeling better.  She has been supplemented with vitamin D.  She has been doing that over-the-counter.  She works in [employer] but has not been able to do that lately.  Is reporting minimal to no pain in the right ankle.  She has been ambulating in a fracture boot.  On examination negative Homans no calf tenderness.  Very mild swelling around the lateral malleolus but no tenderness on the medial malleolus and minimal to no tenderness over the lateral malleolus.  Ankle range of motion is only about 10 degrees off the contralateral side.  Radiographs look good.  Okay to return to work in 2 weeks in a fracture boot.  In 2 weeks however she can take the fracture boot off when she is walking at home.  4-week return with repeat radiographs and release at that time.  Okay for weightbearing as tolerated in the fracture boot at this time as well.  Follow-Up Instructions: Return in about 4 weeks (around 04/04/2021).   Orders:  Orders Placed This Encounter  Procedures  . XR Ankle Complete Right   No orders of the defined types were placed in this encounter.   Imaging: XR Ankle Complete Right  Result Date: 03/08/2021 AP lateral mortise radiographs right ankle reviewed.  Fibular fracture shows early callus formation with no further displacement compared to radiographs from initial injury.  Ankle mortise is symmetric.  At most 2 mm of lateral displacement is present with no  shortening.   PMFS History: Patient Active Problem List   Diagnosis Date Noted  . 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 Major depressive disorder, single episode, severe without psychosis (HCC) 10/22/2017   Past Medical History:  Diagnosis Date  . Asthma   . Migraine     History reviewed. No pertinent family history.  Past Surgical History:  Procedure Laterality Date  . NO PAST SURGERIES     Social History   Occupational History  . Not on file  Tobacco Use  . Smoking status: Current Every Day Smoker    Packs/day: 0.50    Types: Cigarettes  . Smokeless tobacco: Never Used  Substance and Sexual Activity  . Alcohol use: No  . Drug use: No  . Sexual activity: Yes    Birth control/protection: None

## 2021-03-31 IMAGING — US US PELVIS COMPLETE
1 series · 15 of 25 positions shown · non-contrast
Comparison: None

CLINICAL DATA: Irregular menstrual cycle, LMP 04/13/2020

EXAM:
TRANSABDOMINAL ULTRASOUND OF PELVIS
TECHNIQUE: Transabdominal ultrasound examination of the pelvis was performed
including evaluation of the uterus, ovaries, adnexal regions, and
pelvic cul-de-sac.

[Series 1: us pelvis complete · 15 of 29 slices shown]
[im 1/29]
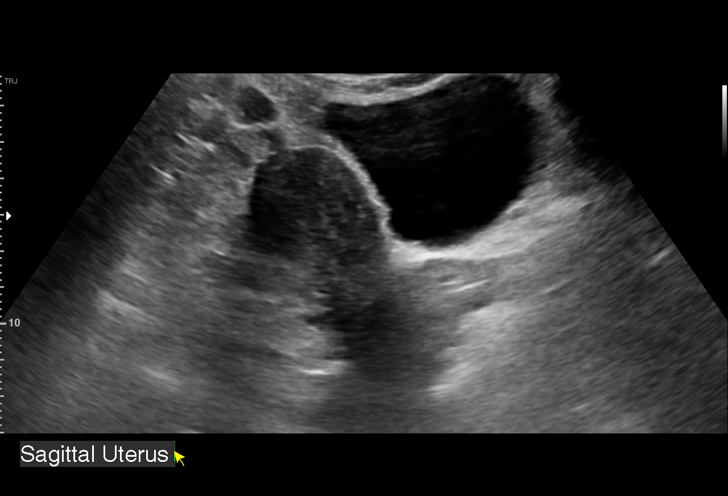
[im 3/29]
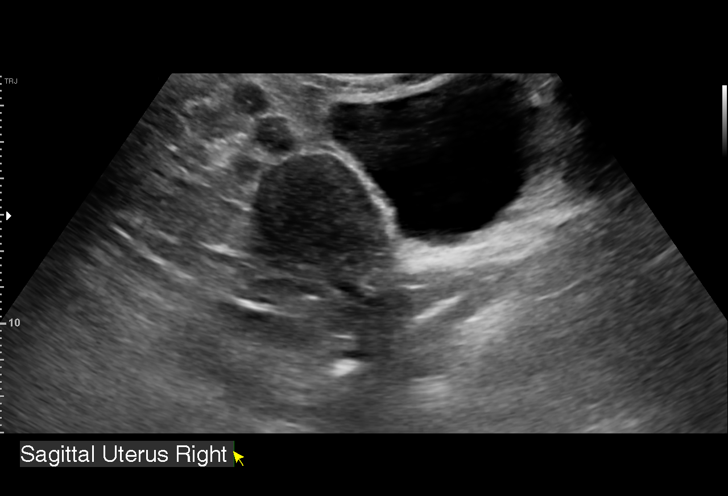
[im 5/29]
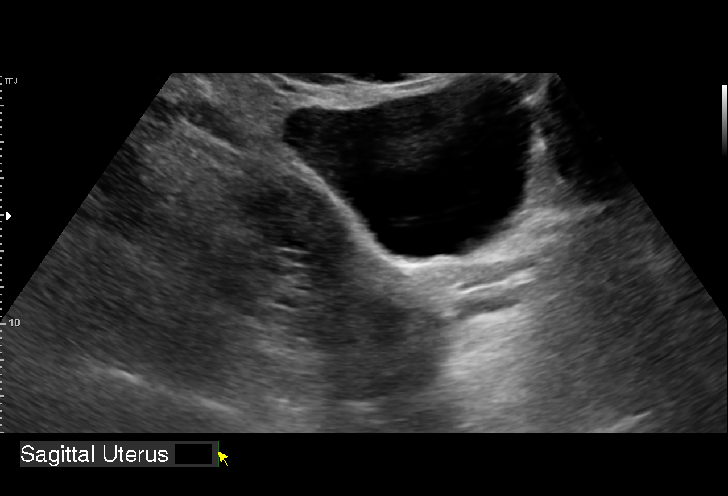
[im 6/29]
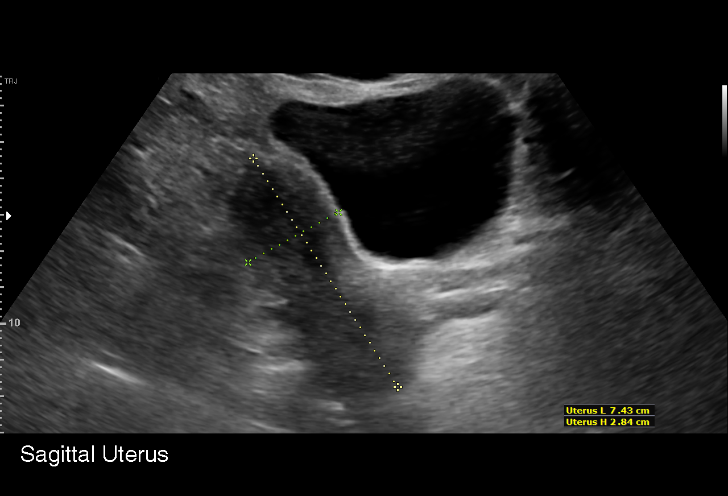
[im 9/29]
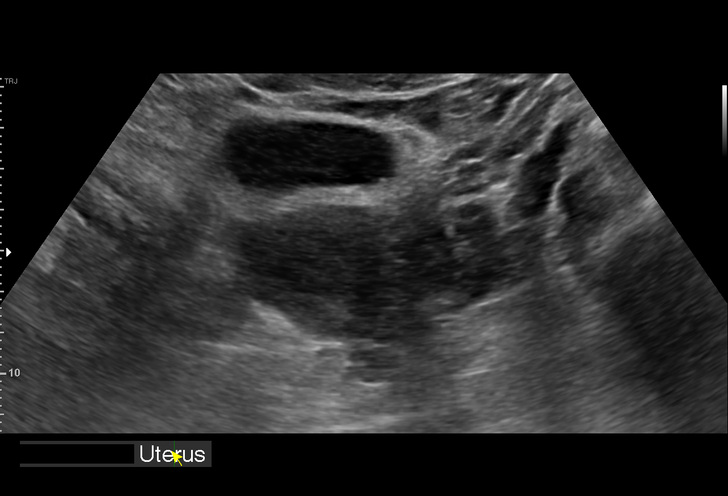
[im 11/29]
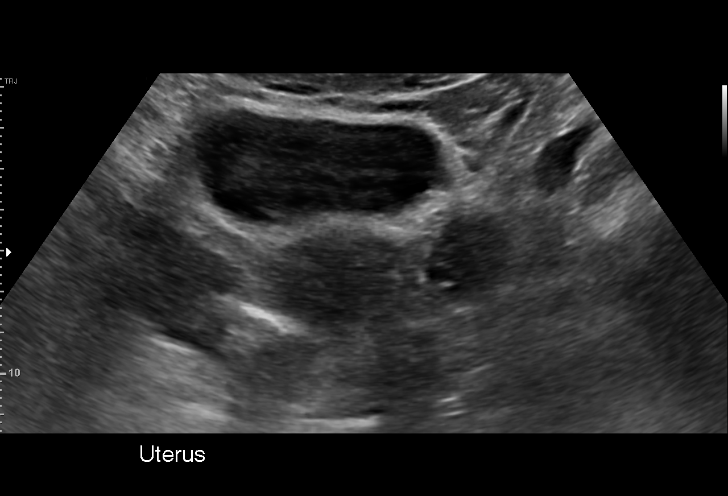
[im 12/29]
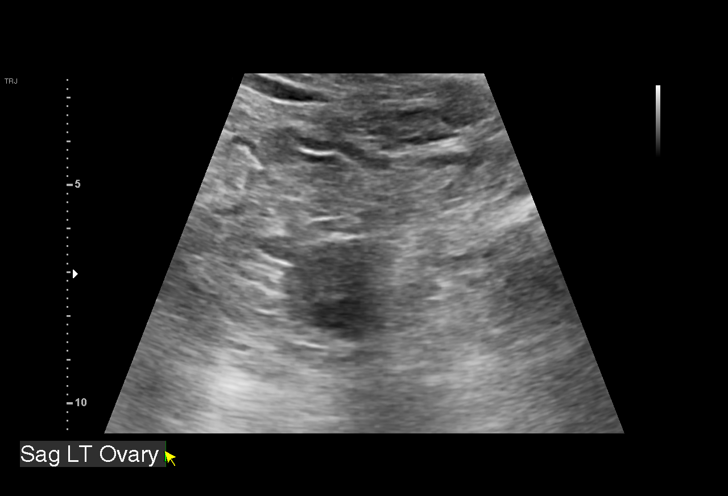
[im 15/29]
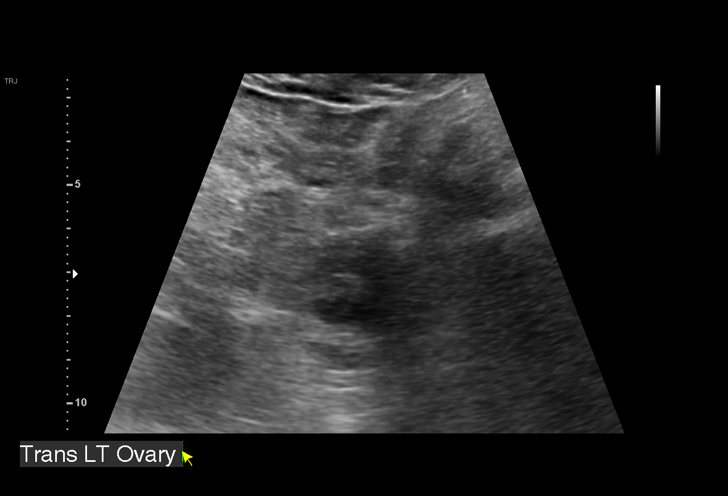
[im 17/29]
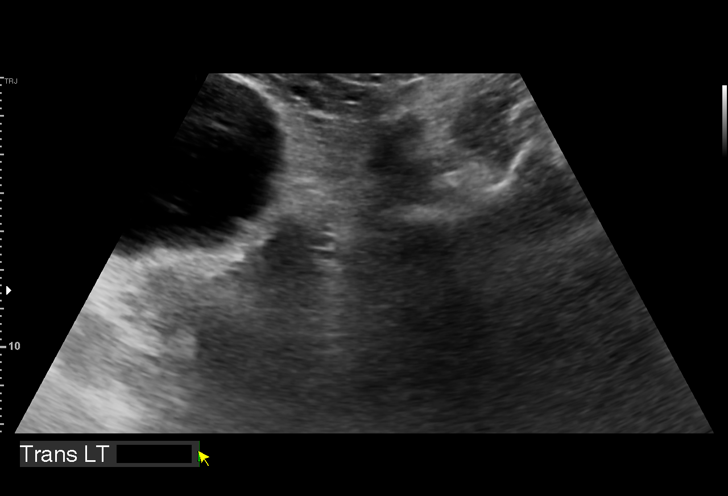
[im 18/29]
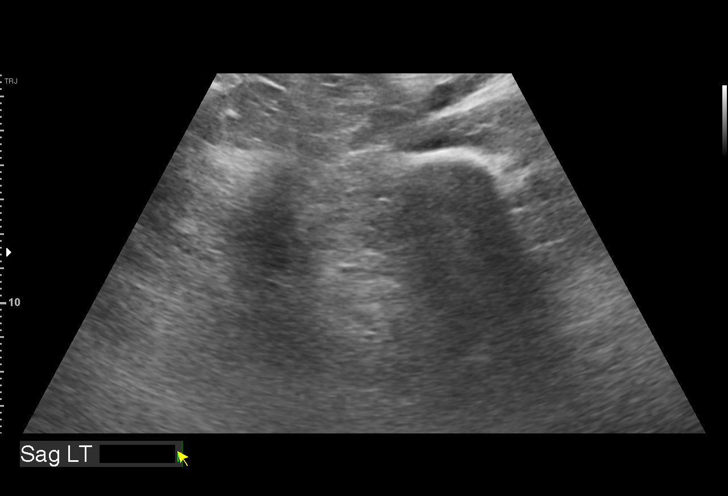
[im 20/29]
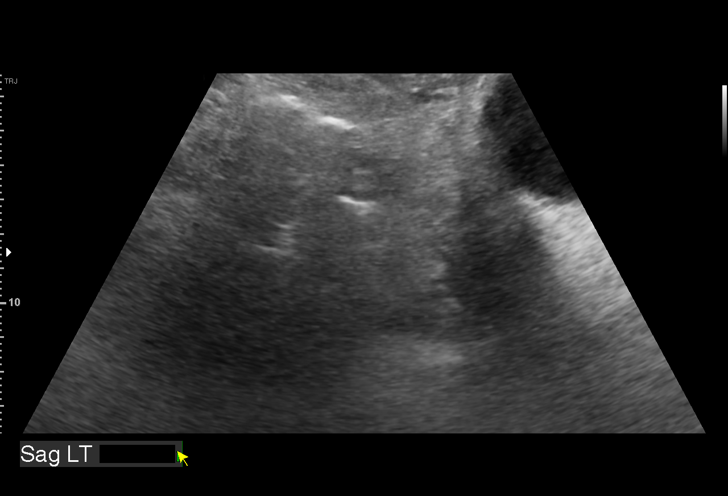
[im 23/29]
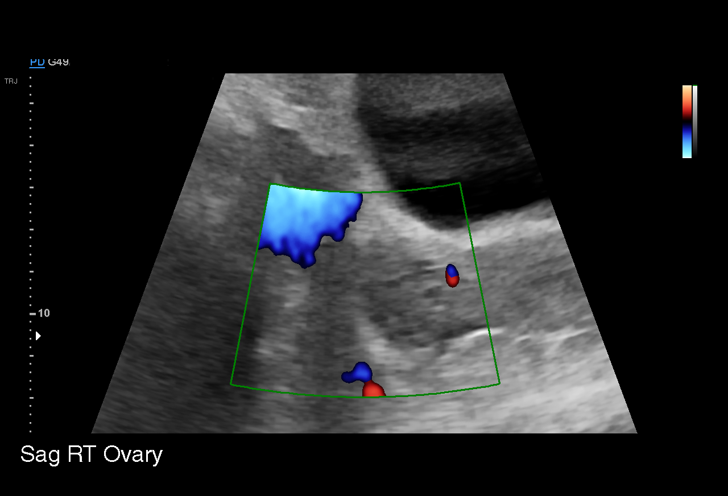
[im 24/29]
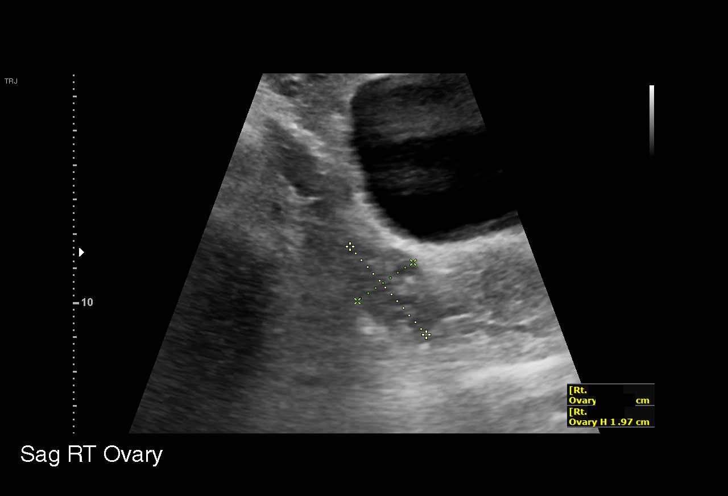
[im 26/29]
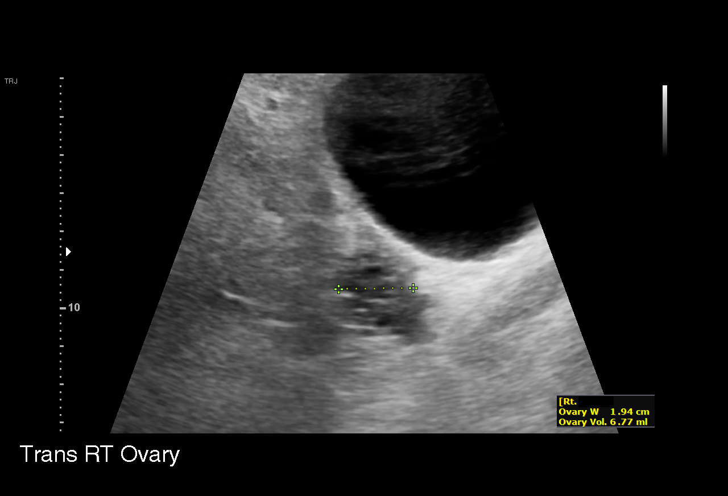
[im 29/29]
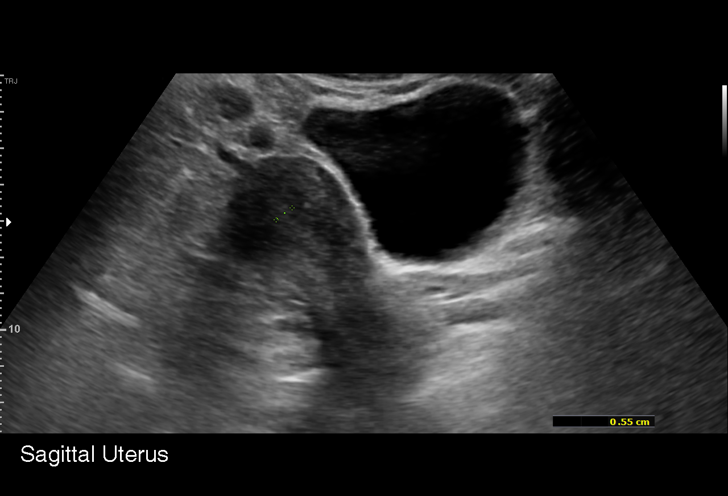

[15 of 25 positions shown; findings below may reference images not displayed]

FINDINGS: Uterus

Measurements: 7.4 x 2.8 x 5.3 cm = volume: 58 mL. Anteverted. Normal
morphology without mass. Cervix poorly visualized.

Endometrium

Thickness: 6 mm.  No endometrial fluid or focal abnormality.

Right ovary

Measurements: 3.4 x 2.0 x 2.0 cm = volume: 6.8 mL. Normal morphology
without mass

Left ovary

Measurements: 2.3 x 2.1 x 2.4 cm = volume: 6.1 mL. Normal morphology
without mass

Other findings: No free pelvic fluid. No adnexal masses. Visualized
urinary bladder unremarkable.
IMPRESSION: No pelvic sonographic abnormalities identified.

## 2021-04-04 ENCOUNTER — Ambulatory Visit: Payer: Medicaid Other | Admitting: Orthopedic Surgery

## 2021-04-16 ENCOUNTER — Ambulatory Visit (INDEPENDENT_AMBULATORY_CARE_PROVIDER_SITE_OTHER): Payer: Medicaid Other | Admitting: Orthopedic Surgery

## 2021-04-16 ENCOUNTER — Ambulatory Visit: Payer: Self-pay

## 2021-04-16 DIAGNOSIS — M25571 Pain in right ankle and joints of right foot: Secondary | ICD-10-CM

## 2021-04-19 ENCOUNTER — Encounter: Payer: Self-pay | Admitting: Orthopedic Surgery

## 2021-04-19 NOTE — Progress Notes (Signed)
   Post-fracture visit Note   Patient: Sylvia Mccann           Date of Birth: 02-Feb-1991           MRN: [id number] Visit Date: 04/16/2021 PCP: Patient, No Pcp Per (Inactive)   Assessment & Plan:  Chief Complaint:  Chief Complaint  Patient presents with   Right Ankle - Pain, Follow-up   Visit Diagnoses:  1. Pain in right ankle and joints of right foot     Plan: Patient is a 30 year old female who presents for follow-up of right ankle fracture treated nonoperatively.  Date of injury was 02/06/2021.  She reports that she is doing well.  Pain is consistently improving and she is now ambulating full weightbearing in a regular shoe.  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  She does note some soreness at times along with swelling over the lateral ankle but nothing that keeps her from doing what she wants to do.  She elevates the leg when she is at home after work to help with the swelling.  On exam she has 2+ DP pulse with excellent passive dorsiflexion of the right ankle to 15 degrees past neutral.  No calf tenderness.  Negative Homans' sign.  Syndesmosis stable on exam.  No tenderness over the medial malleolus.  Mild tenderness over the lateral malleolus that patient rates 2/10.  Mild swelling over the lateral ankle, no ecchymosis noted.  Plan to have patient return to work in full capacity and have her follow-up as needed if any concerns arise.  Follow-Up Instructions: No follow-ups on file.   Orders:  Orders Placed This Encounter  Procedures   XR Ankle Complete Right   No orders of the defined types were placed in this encounter.   Imaging: No results found.  PMFS History: Patient Active Problem List   Diagnosis Date Noted   Major depressive disorder, single episode, severe without psychotic features (HCC) 10/22/2017   Major depressive disorder, single episode, severe without psychosis (HCC) 10/22/2017   Past Medical History:  Diagnosis  Date   Asthma    Migraine     No family history on file.  Past Surgical History:  Procedure Laterality Date   NO PAST SURGERIES     Social History   Occupational History   Not on file  Tobacco Use   Smoking status: Every Day    Packs/day: 0.50    Pack years: 0.00    Types: Cigarettes   Smokeless tobacco: Never  Substance and Sexual Activity   Alcohol use: No   Drug use: No   Sexual activity: Yes    Birth control/protection: None

## 2021-04-20 ENCOUNTER — Encounter: Payer: Self-pay | Admitting: Orthopedic Surgery

## 2021-09-27 ENCOUNTER — Emergency Department (HOSPITAL_COMMUNITY): Payer: Medicaid Other

## 2021-09-27 ENCOUNTER — Other Ambulatory Visit: Payer: Self-pay

## 2021-09-27 ENCOUNTER — Encounter (HOSPITAL_COMMUNITY): Payer: Self-pay | Admitting: Emergency Medicine

## 2021-09-27 ENCOUNTER — Emergency Department (HOSPITAL_COMMUNITY)
Admission: EM | Admit: 2021-09-27 | Discharge: 2021-09-27 | Disposition: A | Discharge status: Home / Self Care | Payer: Medicaid Other | Attending: Emergency Medicine | Admitting: Emergency Medicine

## 2021-09-27 DIAGNOSIS — J069 Acute upper respiratory infection, unspecified: Secondary | ICD-10-CM

## 2021-09-27 DIAGNOSIS — F17210 Nicotine dependence, cigarettes, uncomplicated: Secondary | ICD-10-CM | POA: Insufficient documentation

## 2021-09-27 DIAGNOSIS — Z7982 Long term (current) use of aspirin: Secondary | ICD-10-CM | POA: Insufficient documentation

## 2021-09-27 DIAGNOSIS — Z20822 Contact with and (suspected) exposure to covid-19: Secondary | ICD-10-CM | POA: Insufficient documentation

## 2021-09-27 DIAGNOSIS — Z79899 Other long term (current) drug therapy: Secondary | ICD-10-CM | POA: Insufficient documentation

## 2021-09-27 DIAGNOSIS — J4520 Mild intermittent asthma, uncomplicated: Secondary | ICD-10-CM | POA: Insufficient documentation

## 2021-09-27 LAB — RESP PANEL BY RT-PCR (FLU A&B, COVID) ARPGX2
Influenza A by PCR: NEGATIVE
Influenza B by PCR: NEGATIVE
SARS Coronavirus 2 by RT PCR: NEGATIVE

## 2021-09-27 LAB — I-STAT BETA HCG BLOOD, ED (MC, WL, AP ONLY): I-stat hCG, quantitative: <5 m[IU]/mL (ref <5)

## 2021-09-27 MED ORDER — ALBUTEROL SULFATE HFA 108 (90 BASE) MCG/ACT IN AERS
4.0000 | INHALATION_SPRAY | Freq: Once | RESPIRATORY_TRACT | Status: AC
  Administered 2021-09-27: 4 via RESPIRATORY_TRACT
  Filled 2021-09-27: qty 6.7

## 2021-09-27 MED ORDER — ONDANSETRON HCL 4 MG PO TABS
4.0000 mg | ORAL_TABLET | Freq: Four times a day (QID) | ORAL | 0 refills | Status: DC
Start: 2021-09-27 — End: 2021-09-27

## 2021-09-27 MED ORDER — PREDNISONE 10 MG PO TABS
40.0000 mg | ORAL_TABLET | Freq: Every day | ORAL | 0 refills | Status: AC
Start: 2021-09-27 — End: 2021-10-01

## 2021-09-27 MED ORDER — ONDANSETRON HCL 4 MG PO TABS
4.0000 mg | ORAL_TABLET | Freq: Four times a day (QID) | ORAL | 0 refills | Status: DC
Start: 2021-09-27 — End: 2023-06-07

## 2021-09-27 MED ORDER — PREDNISONE 10 MG PO TABS
40.0000 mg | ORAL_TABLET | Freq: Every day | ORAL | 0 refills | Status: DC
Start: 2021-09-27 — End: 2023-06-07

## 2021-09-27 NOTE — ED Notes (Signed)
Patient discharge instructions reviewed with the patient. Patient discharged.

## 2021-09-27 NOTE — ED Triage Notes (Signed)
Pt states she was COVID + approx 3 weeks ago.  Reports fever, chills, congestion, SOB, nausea,vomiting, and diarrhea since Friday.

## 2021-09-27 NOTE — ED Provider Notes (Signed)
Emergency Medicine Provider Triage Evaluation Note  Sylvia Mccann , a 30 y.o. female  was evaluated in triage.  Pt complains of nasal congestion since yesterday.  She has associated trouble breathing, wheezing, fever (max temp 100 yesterday), vomiting (yesterday, resolved), sore throat.  Patient has not tried any medications at home for symptoms.  Patient denies sick contacts.  Denies chest pain, chills, nausea.  Patient has a history of asthma however she does not have an inhaler at home to use.  Review of Systems  Positive: Nasal congestion, trouble breathing Negative: Chest pain, chills  Physical Exam  BP 112/82 (BP Location: Right Arm)   Pulse 99   Temp 98.4 F (36.9 C) (Oral)   Resp 18   LMP 08/31/2021   SpO2 96%  Gen:   Awake, no distress   Resp:  Normal effort, mild expiratory wheezing throughout lung fields MSK:   Moves extremities without difficulty   Medical Decision Making  Medically screening exam initiated at 2:37 PM.  Appropriate orders placed.  Sylvia Mccann was informed that the remainder of the evaluation will be completed by another provider, this initial triage assessment does not replace that evaluation, and the importance of remaining in the ED until their evaluation is complete.    Sylvia Mccann A, PA-C 09/27/21 1442    Sylvia Munch, MD 09/27/21 1744

## 2021-09-27 NOTE — ED Provider Notes (Signed)
[place] EMERGENCY DEPARTMENT Provider Note   CSN: [id number] Arrival date & time: 09/27/21  1338     History Chief Complaint  Patient presents with   Leg Pain    Sylvia Mccann is a 30 y.o. female.  The history is provided by the patient and medical records.  Cough Cough characteristics:  Non-productive Sputum characteristics:  Nondescript Severity:  Moderate Onset quality:  Gradual Duration:  3 days Timing:  Constant Progression:  Worsening Chronicity:  New Context: upper respiratory infection   Relieved by:  Nothing Worsened by:  Nothing Associated symptoms: chills, rhinorrhea and shortness of breath   Associated symptoms: no chest pain, no ear pain, no fever, no rash and no sore throat       Past Medical History:  Diagnosis Date   Asthma    Migraine     Patient Active Problem List   Diagnosis Date Noted   Major depressive disorder, single episode, severe without psychotic features ([place]) 10/22/2017   Major depressive disorder, single episode, severe without psychosis ([place]) 10/22/2017    Past Surgical History:  Procedure Laterality Date   NO PAST SURGERIES       OB History   No obstetric history on file.     No family history on file.  Social History   Tobacco Use   Smoking status: Every Day    Packs/day: 0.50    Types: Cigarettes   Smokeless tobacco: Never  Substance Use Topics   Alcohol use: No   Drug use: No    Home Medications Prior to Admission medications   Medication Sig Start Date End Date Taking? Authorizing Provider  predniSONE (DELTASONE) 10 MG tablet Take 4 tablets (40 mg total) by mouth daily. 09/27/21  Yes Trinitee Horgan, Burnadette Peter, MD  predniSONE (DELTASONE) 10 MG tablet Take 4 tablets (40 mg total) by mouth daily for 4 days. 09/27/21 10/01/21 Yes Leisha Trinkle, Burnadette Peter, MD  albuterol (PROVENTIL HFA;VENTOLIN HFA) 108 (90 Base) MCG/ACT inhaler Inhale 1-2 puffs into the lungs every 6 (six) hours as needed for wheezing or  shortness of breath. Patient not taking: Reported on 04/18/2020    [provider]  aspirin EC 81 MG tablet Take 1 tablet (81 mg total) by mouth daily. Swallow whole. 02/12/21   Meredith Pel, MD  benzonatate (TESSALON) 100 MG capsule Take 1 capsule (100 mg total) by mouth every 8 (eight) hours. Patient not taking: Reported on 04/18/2020 11/14/18   Darlin Drop P, PA-C  cyclobenzaprine (FLEXERIL) 10 MG tablet Take 1 tablet (10 mg total) by mouth 2 (two) times daily as needed for muscle spasms. Patient not taking: Reported on 04/18/2020 12/28/19   Domenic Moras, PA-C  doxycycline (VIBRA-TABS) 100 MG tablet Take 1 tablet (100 mg total) by mouth 2 (two) times daily. Patient not taking: Reported on 04/18/2020 03/02/20   Dorie Rank, MD  fluticasone [facility]) 50 MCG/ACT nasal spray Place 1 spray into both nostrils daily. Patient not taking: Reported on 04/18/2020 11/14/18   Arville Lime, PA-C  HYDROcodone-acetaminophen (NORCO/VICODIN) 5-325 MG tablet Take 1 tablet by mouth every 6 (six) hours as needed for severe pain. 02/07/21   Jaynee Eagles, PA-C  ibuprofen (ADVIL) 600 MG tablet Take 1 tablet (600 mg total) by mouth every 6 (six) hours as needed. Patient not taking: Reported on 04/18/2020 12/28/19   Domenic Moras, PA-C  naproxen (NAPROSYN) 500 MG tablet Take 1 tablet (500 mg total) by mouth 2 (two) times daily with a meal. As needed for pain Patient  not taking: Reported on 04/18/2020 03/02/20   Linwood Dibbles, MD  nortriptyline (PAMELOR) 25 MG capsule Take 50 mg by mouth at bedtime. Patient not taking: Reported on 04/18/2020    [provider]  ondansetron (ZOFRAN) 4 MG tablet Take 1 tablet (4 mg total) by mouth every 6 (six) hours. 09/27/21   Lutricia Feil, MD  ondansetron (ZOFRAN) 4 MG tablet Take 1 tablet (4 mg total) by mouth every 6 (six) hours. 09/27/21   Lutricia Feil, MD  predniSONE (DELTASONE) 50 MG tablet One tab PO daily for 4 days Patient not taking: Reported on 04/18/2020 05/06/18    Zadie Rhine, MD  tranexamic acid (LYSTEDA) 650 MG TABS tablet Take 2 tablets (1,300 mg total) by mouth 3 (three) times daily. 04/18/20   Malachy Chamber, MD  Vitamin D, Ergocalciferol, (DRISDOL) 1.25 MG (50000 UNIT) CAPS capsule Take 1 capsule (50,000 Units total) by mouth every 7 (seven) days. 02/24/21   Magnant, Joycie Peek, PA-C    Allergies    Diclofenac potassium(migraine)  Review of Systems   Review of Systems  Constitutional:  Positive for appetite change, chills and fatigue. Negative for fever.  HENT:  Positive for congestion and rhinorrhea. Negative for ear pain and sore throat.   Eyes:  Negative for pain and visual disturbance.  Respiratory:  Positive for cough, chest tightness and shortness of breath.   Cardiovascular:  Negative for chest pain and palpitations.  Gastrointestinal:  Positive for nausea. Negative for abdominal pain and vomiting.  Genitourinary:  Negative for dysuria and hematuria.  Musculoskeletal:  Negative for arthralgias and back pain.  Skin:  Negative for color change and rash.  Neurological:  Negative for seizures and syncope.  All other systems reviewed and are negative.  Physical Exam Updated Vital Signs BP 112/82 (BP Location: Right Arm)   Pulse 99   Temp 98.4 F (36.9 C) (Oral)   Resp 18   LMP 08/31/2021   SpO2 96%   Physical Exam Vitals and nursing note reviewed.  Constitutional:      General: She is not in acute distress.    Appearance: Normal appearance. She is well-developed.  HENT:     Head: Normocephalic and atraumatic.     Right Ear: External ear normal.     Left Ear: External ear normal.     Nose: Nose normal. No congestion or rhinorrhea.     Mouth/Throat:     Mouth: Mucous membranes are moist.  Eyes:     Extraocular Movements: Extraocular movements intact.     Conjunctiva/sclera: Conjunctivae normal.     Pupils: Pupils are equal, round, and reactive to light.  Cardiovascular:     Rate and Rhythm: Normal rate and regular  rhythm.     Pulses: Normal pulses.     Heart sounds: No murmur heard. Pulmonary:     Effort: Pulmonary effort is normal. No respiratory distress.     Breath sounds: Wheezing (faint expiratory wheezing b/l) present. No rhonchi or rales.  Abdominal:     General: Abdomen is flat. Bowel sounds are normal.     Palpations: Abdomen is soft.     Tenderness: There is no abdominal tenderness. There is no guarding or rebound.  Musculoskeletal:        General: No swelling, tenderness or deformity.     Cervical back: Normal range of motion and neck supple. No rigidity.  Skin:    General: Skin is warm and dry.     Capillary Refill: Capillary refill takes less  than 2 seconds.  Neurological:     General: No focal deficit present.     Mental Status: She is alert and oriented to person, place, and time.  Psychiatric:        Mood and Affect: Mood normal.    ED Results / Procedures / Treatments   Labs (all labs ordered are listed, but only abnormal results are displayed) Labs Reviewed  RESP PANEL BY RT-PCR (FLU A&B, COVID) ARPGX2  I-STAT BETA HCG BLOOD, ED ([facility], [facility], [facility] ONLY)    EKG None  Radiology DG Chest 2 View  Result Date: 09/27/2021 CLINICAL DATA:  Two days of shortness of breath.  History of asthma. EXAM: CHEST - 2 VIEW COMPARISON:  May 06, 2018 FINDINGS: The heart size and mediastinal contours are within normal limits. No focal consolidation. No pleural effusion. No pneumothorax. The visualized skeletal structures are unremarkable. Bilateral nipple ornamentation. IMPRESSION: No active cardiopulmonary disease. Electronically Signed   By: Maudry Mayhew M.D.   On: 09/27/2021 15:37    Procedures Procedures   Medications Ordered in ED Medications  albuterol (VENTOLIN HFA) 108 (90 Base) MCG/ACT inhaler 4 puff (4 puffs Inhalation Given 09/27/21 1743)    ED Course  I have reviewed the triage vital signs and the nursing notes.  Pertinent labs & imaging results that were available during  my care of the patient were reviewed by me and considered in my medical decision making (see chart for details).    MDM Rules/Calculators/A&P                          30 year old female with flulike symptoms.  She does have faint expiratory wheezing on exam.  History of asthma, but ran out of her rescue inhaler.  She is not in any acute respiratory distress.  She not requiring oxygen.  She was given 2 puffs of albuterol and a dose of steroids.  Wheezing resolved.  She is well-appearing.  Tolerating p.o.  Chest x-ray showed no acute cardiopulmonary process.  She is PERC negative.  Low concern for PE.  COVID and flu negative.  Likely other viral illness.  Encouraged continued use of albuterol inhaler at home and steroid course.  Patient follow-up with PCP in 3 to 5 days for reassessment.  Strict return ED precautions provided.  Final Clinical Impression(s) / ED Diagnoses Final diagnoses:  Viral URI  Mild intermittent asthma without complication    Rx / DC Orders ED Discharge Orders          Ordered    predniSONE (DELTASONE) 10 MG tablet  Daily        09/27/21 1742    ondansetron (ZOFRAN) 4 MG tablet  Every 6 hours,   Status:  Discontinued        09/27/21 1742    ondansetron (ZOFRAN) 4 MG tablet  Every 6 hours,   Status:  Discontinued        09/27/21 1744    predniSONE (DELTASONE) 10 MG tablet  Daily        09/27/21 1744    ondansetron (ZOFRAN) 4 MG tablet  Every 6 hours        09/27/21 1744    ondansetron (ZOFRAN) 4 MG tablet  Every 6 hours        09/27/21 1744             Lutricia Feil, MD 09/27/21 1747    Tegeler, Canary Brim, MD 09/30/21 1148

## 2022-09-25 DIAGNOSIS — Z419 Encounter for procedure for purposes other than remedying health state, unspecified: Secondary | ICD-10-CM | POA: Diagnosis not present

## 2022-10-26 DIAGNOSIS — Z419 Encounter for procedure for purposes other than remedying health state, unspecified: Secondary | ICD-10-CM | POA: Diagnosis not present

## 2022-10-26 NOTE — L&D Delivery Note (Signed)
OB/GYN Faculty Practice Delivery Note  Jamar Rummell is a 32 y.o. G1P1001 s/p Preterm delivery at Unknown gestation in main ED. She was admitted for SVD in labor.   ROM: rupture date, rupture time, delivery date, or delivery time have not been documented with clear fluid GBS Status: unknown   Maximum Maternal Temperature: 99.9 F  Labor Progress: Initial SVE: 9/90/+1. She then progressed to complete.   Delivery Date/Time: 06/05/23 0344 Delivery: Called to ER and patient was complete and pushing. Head delivered OA. Nuchal cord present x1. Shoulder and body delivered in usual fashion. Infant with spontaneous cry, placed on mother's abdomen, dried and stimulated. Cord clamped x 2 immediately, and cut by Dr. Camelia Phenes. Cord blood drawn. Placenta delivered spontaneously. Fundus firm with massage and Pitocin. Labia, perineum, vagina, and cervix inspected with no lacerations noted.  Baby Weight: pending  Placenta: 3 vessel, intact. Sent to path Complications: precipitous delivery, preterm Lacerations: as above EBL: 103 mL Analgesia: none  Infant:  APGAR (1 MIN): 4 APGAR (5 MINS): 8  Myrtie Hawk, DO OB Family Medicine Fellow, [facility] for [facility], [facility] 06/05/2023, 4:42 AM

## 2022-11-26 DIAGNOSIS — Z419 Encounter for procedure for purposes other than remedying health state, unspecified: Secondary | ICD-10-CM | POA: Diagnosis not present

## 2022-12-01 ENCOUNTER — Telehealth: Payer: Self-pay

## 2022-12-01 NOTE — Telephone Encounter (Signed)
Mychart msg sent. AS, CMA 

## 2022-12-25 DIAGNOSIS — Z419 Encounter for procedure for purposes other than remedying health state, unspecified: Secondary | ICD-10-CM | POA: Diagnosis not present

## 2023-01-25 DIAGNOSIS — Z419 Encounter for procedure for purposes other than remedying health state, unspecified: Secondary | ICD-10-CM | POA: Diagnosis not present

## 2023-02-24 DIAGNOSIS — Z419 Encounter for procedure for purposes other than remedying health state, unspecified: Secondary | ICD-10-CM | POA: Diagnosis not present

## 2023-03-27 DIAGNOSIS — Z419 Encounter for procedure for purposes other than remedying health state, unspecified: Secondary | ICD-10-CM | POA: Diagnosis not present

## 2023-03-27 DIAGNOSIS — R000 Tachycardia, unspecified: Secondary | ICD-10-CM | POA: Diagnosis not present

## 2023-03-27 DIAGNOSIS — I10 Essential (primary) hypertension: Secondary | ICD-10-CM | POA: Diagnosis not present

## 2023-03-27 DIAGNOSIS — R062 Wheezing: Secondary | ICD-10-CM | POA: Diagnosis not present

## 2023-04-26 DIAGNOSIS — Z419 Encounter for procedure for purposes other than remedying health state, unspecified: Secondary | ICD-10-CM | POA: Diagnosis not present

## 2023-05-27 DIAGNOSIS — Z419 Encounter for procedure for purposes other than remedying health state, unspecified: Secondary | ICD-10-CM | POA: Diagnosis not present

## 2023-06-05 ENCOUNTER — Other Ambulatory Visit: Payer: Self-pay

## 2023-06-05 ENCOUNTER — Inpatient Hospital Stay (HOSPITAL_COMMUNITY)
Admission: EM | Admit: 2023-06-05 | Discharge: 2023-06-07 | DRG: 806 | Disposition: A | Discharge status: Home / Self Care | Payer: 59 | Attending: Obstetrics and Gynecology | Admitting: Obstetrics and Gynecology

## 2023-06-05 ENCOUNTER — Encounter (HOSPITAL_COMMUNITY): Payer: Self-pay | Admitting: *Deleted

## 2023-06-05 DIAGNOSIS — R1084 Generalized abdominal pain: Secondary | ICD-10-CM | POA: Diagnosis not present

## 2023-06-05 DIAGNOSIS — Z4682 Encounter for fitting and adjustment of non-vascular catheter: Secondary | ICD-10-CM | POA: Diagnosis not present

## 2023-06-05 DIAGNOSIS — Z7982 Long term (current) use of aspirin: Secondary | ICD-10-CM

## 2023-06-05 DIAGNOSIS — O99314 Alcohol use complicating childbirth: Secondary | ICD-10-CM | POA: Diagnosis not present

## 2023-06-05 DIAGNOSIS — O99344 Other mental disorders complicating childbirth: Secondary | ICD-10-CM | POA: Diagnosis not present

## 2023-06-05 DIAGNOSIS — Z452 Encounter for adjustment and management of vascular access device: Secondary | ICD-10-CM | POA: Diagnosis not present

## 2023-06-05 DIAGNOSIS — F1490 Cocaine use, unspecified, uncomplicated: Secondary | ICD-10-CM | POA: Diagnosis not present

## 2023-06-05 DIAGNOSIS — Z3A28 28 weeks gestation of pregnancy: Secondary | ICD-10-CM

## 2023-06-05 DIAGNOSIS — O039 Complete or unspecified spontaneous abortion without complication: Secondary | ICD-10-CM | POA: Diagnosis not present

## 2023-06-05 DIAGNOSIS — O99324 Drug use complicating childbirth: Secondary | ICD-10-CM | POA: Diagnosis not present

## 2023-06-05 DIAGNOSIS — F322 Major depressive disorder, single episode, severe without psychotic features: Secondary | ICD-10-CM | POA: Diagnosis present

## 2023-06-05 DIAGNOSIS — O26893 Other specified pregnancy related conditions, third trimester: Secondary | ICD-10-CM | POA: Diagnosis not present

## 2023-06-05 DIAGNOSIS — Z23 Encounter for immunization: Secondary | ICD-10-CM

## 2023-06-05 DIAGNOSIS — O0933 Supervision of pregnancy with insufficient antenatal care, third trimester: Secondary | ICD-10-CM | POA: Diagnosis not present

## 2023-06-05 DIAGNOSIS — F17210 Nicotine dependence, cigarettes, uncomplicated: Secondary | ICD-10-CM | POA: Diagnosis present

## 2023-06-05 DIAGNOSIS — O99310 Alcohol use complicating pregnancy, unspecified trimester: Secondary | ICD-10-CM

## 2023-06-05 DIAGNOSIS — O99320 Drug use complicating pregnancy, unspecified trimester: Secondary | ICD-10-CM | POA: Insufficient documentation

## 2023-06-05 DIAGNOSIS — O99334 Smoking (tobacco) complicating childbirth: Secondary | ICD-10-CM | POA: Diagnosis present

## 2023-06-05 DIAGNOSIS — Z3A00 Weeks of gestation of pregnancy not specified: Secondary | ICD-10-CM | POA: Diagnosis not present

## 2023-06-05 DIAGNOSIS — Z051 Observation and evaluation of newborn for suspected infectious condition ruled out: Secondary | ICD-10-CM | POA: Diagnosis not present

## 2023-06-05 LAB — COMPREHENSIVE METABOLIC PANEL
ALT: 21 U/L (ref 0–44)
AST: 22 U/L (ref 15–41)
Albumin: 2.5 g/dL — ABNORMAL LOW (ref 3.5–5.0)
Alkaline Phosphatase: 123 U/L (ref 38–126)
Anion gap: 10 (ref 5–15)
BUN: <5 mg/dL — ABNORMAL LOW (ref 6–20)
CO2: 21 mmol/L — ABNORMAL LOW (ref 22–32)
Calcium: 9.1 mg/dL (ref 8.9–10.3)
Chloride: 102 mmol/L (ref 98–111)
Creatinine, Ser: 0.71 mg/dL (ref 0.44–1.00)
GFR, Estimated: >60 mL/min (ref >60)
Glucose, Bld: 110 mg/dL — ABNORMAL HIGH (ref 70–99)
Potassium: 3.5 mmol/L (ref 3.5–5.1)
Sodium: 133 mmol/L — ABNORMAL LOW (ref 135–145)
Total Bilirubin: 0.5 mg/dL (ref 0.3–1.2)
Total Protein: 6.4 g/dL — ABNORMAL LOW (ref 6.5–8.1)

## 2023-06-05 LAB — CBC WITH DIFFERENTIAL/PLATELET
Abs Immature Granulocytes: 0.21 10*3/uL — ABNORMAL HIGH (ref 0.00–0.07)
Basophils Absolute: 0.1 10*3/uL (ref 0.0–0.1)
Basophils Relative: 0 %
Eosinophils Absolute: 0.2 10*3/uL (ref 0.0–0.5)
Eosinophils Relative: 1 %
HCT: 33.1 % — ABNORMAL LOW (ref 36.0–46.0)
Hemoglobin: 10.6 g/dL — ABNORMAL LOW (ref 12.0–15.0)
Immature Granulocytes: 1 %
Lymphocytes Relative: 10 %
Lymphs Abs: 2 10*3/uL (ref 0.7–4.0)
MCH: 28.5 pg (ref 26.0–34.0)
MCHC: 32 g/dL (ref 30.0–36.0)
MCV: 89 fL (ref 80.0–100.0)
Monocytes Absolute: 1.4 10*3/uL — ABNORMAL HIGH (ref 0.1–1.0)
Monocytes Relative: 7 %
Neutro Abs: 16.1 10*3/uL — ABNORMAL HIGH (ref 1.7–7.7)
Neutrophils Relative %: 81 %
Platelets: 344 10*3/uL (ref 150–400)
RBC: 3.72 MIL/uL — ABNORMAL LOW (ref 3.87–5.11)
RDW: 12.6 % (ref 11.5–15.5)
WBC: 19.9 10*3/uL — ABNORMAL HIGH (ref 4.0–10.5)
nRBC: 0 % (ref 0.0–0.2)

## 2023-06-05 LAB — TYPE AND SCREEN
ABO/RH(D): A POS
Antibody Screen: NEGATIVE

## 2023-06-05 LAB — URINALYSIS, MICROSCOPIC (REFLEX): RBC / HPF: >50 RBC/hpf (ref 0–5)

## 2023-06-05 LAB — URINALYSIS, ROUTINE W REFLEX MICROSCOPIC
Bilirubin Urine: NEGATIVE
Glucose, UA: NEGATIVE mg/dL
Ketones, ur: 5 mg/dL — AB
Nitrite: NEGATIVE
Protein, ur: 100 mg/dL — AB
Specific Gravity, Urine: 1.011 (ref 1.005–1.030)
pH: 9 — ABNORMAL HIGH (ref 5.0–8.0)

## 2023-06-05 LAB — HEPATITIS B SURFACE ANTIGEN: Hepatitis B Surface Ag: NONREACTIVE

## 2023-06-05 LAB — HIV ANTIBODY (ROUTINE TESTING W REFLEX): HIV Screen 4th Generation wRfx: NONREACTIVE

## 2023-06-05 LAB — RAPID URINE DRUG SCREEN, HOSP PERFORMED
Amphetamines: NOT DETECTED
Barbiturates: NOT DETECTED
Benzodiazepines: NOT DETECTED
Cocaine: POSITIVE — AB
Opiates: NOT DETECTED
Tetrahydrocannabinol: NOT DETECTED

## 2023-06-05 MED ORDER — TETANUS-DIPHTH-ACELL PERTUSSIS 5-2.5-18.5 LF-MCG/0.5 IM SUSY
0.5000 mL | PREFILLED_SYRINGE | Freq: Once | INTRAMUSCULAR | Status: AC
  Administered 2023-06-06: 0.5 mL via INTRAMUSCULAR
  Filled 2023-06-05: qty 0.5

## 2023-06-05 MED ORDER — LACTATED RINGERS IV SOLN: 500.0000 mL | INTRAVENOUS | Status: DC | PRN

## 2023-06-05 MED ORDER — FENTANYL CITRATE (PF) 100 MCG/2ML IJ SOLN
50.0000 ug | INTRAMUSCULAR | Status: DC | PRN
  Administered 2023-06-05: 100 ug via INTRAVENOUS
  Filled 2023-06-05: qty 2

## 2023-06-05 MED ORDER — ONDANSETRON HCL 4 MG/2ML IJ SOLN: 4.0000 mg | Freq: Four times a day (QID) | INTRAMUSCULAR | Status: DC | PRN

## 2023-06-05 MED ORDER — OXYCODONE-ACETAMINOPHEN 5-325 MG PO TABS: 2.0000 | ORAL_TABLET | ORAL | Status: DC | PRN

## 2023-06-05 MED ORDER — SIMETHICONE 80 MG PO CHEW: 80.0000 mg | CHEWABLE_TABLET | ORAL | Status: DC | PRN

## 2023-06-05 MED ORDER — OXYTOCIN BOLUS FROM INFUSION
333.0000 mL | Freq: Once | INTRAVENOUS | Status: AC
  Administered 2023-06-05: 333 mL via INTRAVENOUS

## 2023-06-05 MED ORDER — COCONUT OIL OIL
1.0000 | TOPICAL_OIL | Status: DC | PRN
  Administered 2023-06-06: 1 via TOPICAL

## 2023-06-05 MED ORDER — SODIUM CHLORIDE 0.9 % IV SOLN: 250.0000 mL | INTRAVENOUS | Status: DC | PRN

## 2023-06-05 MED ORDER — SENNOSIDES-DOCUSATE SODIUM 8.6-50 MG PO TABS
2.0000 | ORAL_TABLET | ORAL | Status: DC
  Administered 2023-06-05 – 2023-06-06 (×2): 2 via ORAL
  Filled 2023-06-05 (×3): qty 2

## 2023-06-05 MED ORDER — BENZOCAINE-MENTHOL 20-0.5 % EX AERO
1.0000 | INHALATION_SPRAY | CUTANEOUS | Status: DC | PRN
  Administered 2023-06-05: 1 via TOPICAL
  Filled 2023-06-05: qty 56

## 2023-06-05 MED ORDER — SODIUM CHLORIDE 0.9% FLUSH: 3.0000 mL | INTRAVENOUS | Status: DC | PRN

## 2023-06-05 MED ORDER — IBUPROFEN 600 MG PO TABS
600.0000 mg | ORAL_TABLET | Freq: Four times a day (QID) | ORAL | Status: DC
  Administered 2023-06-05 – 2023-06-07 (×9): 600 mg via ORAL
  Filled 2023-06-05 (×9): qty 1

## 2023-06-05 MED ORDER — ACETAMINOPHEN 325 MG PO TABS: 650.0000 mg | ORAL_TABLET | ORAL | Status: DC | PRN

## 2023-06-05 MED ORDER — LIDOCAINE HCL (PF) 1 % IJ SOLN: 30.0000 mL | INTRAMUSCULAR | Status: DC | PRN

## 2023-06-05 MED ORDER — ONDANSETRON HCL 4 MG PO TABS: 4.0000 mg | ORAL_TABLET | ORAL | Status: DC | PRN

## 2023-06-05 MED ORDER — DIBUCAINE (PERIANAL) 1 % EX OINT: 1.0000 | TOPICAL_OINTMENT | CUTANEOUS | Status: DC | PRN

## 2023-06-05 MED ORDER — PRENATAL MULTIVITAMIN CH
1.0000 | ORAL_TABLET | Freq: Every day | ORAL | Status: DC
  Administered 2023-06-05 – 2023-06-06 (×2): 1 via ORAL
  Filled 2023-06-05 (×2): qty 1

## 2023-06-05 MED ORDER — SOD CITRATE-CITRIC ACID 500-334 MG/5ML PO SOLN: 30.0000 mL | ORAL | Status: DC | PRN

## 2023-06-05 MED ORDER — WITCH HAZEL-GLYCERIN EX PADS: 1.0000 | MEDICATED_PAD | CUTANEOUS | Status: DC | PRN

## 2023-06-05 MED ORDER — ONDANSETRON HCL 4 MG/2ML IJ SOLN: 4.0000 mg | INTRAMUSCULAR | Status: DC | PRN

## 2023-06-05 MED ORDER — OXYTOCIN-SODIUM CHLORIDE 30-0.9 UT/500ML-% IV SOLN: 2.5000 [IU]/h | INTRAVENOUS | Status: DC

## 2023-06-05 MED ORDER — OXYCODONE-ACETAMINOPHEN 5-325 MG PO TABS
1.0000 | ORAL_TABLET | ORAL | Status: DC | PRN
  Administered 2023-06-05 (×2): 1 via ORAL
  Filled 2023-06-05 (×2): qty 1

## 2023-06-05 MED ORDER — FENTANYL CITRATE PF 50 MCG/ML IJ SOSY
PREFILLED_SYRINGE | INTRAMUSCULAR | Status: AC
  Filled 2023-06-05: qty 2

## 2023-06-05 MED ORDER — SODIUM CHLORIDE 0.9% FLUSH
3.0000 mL | Freq: Two times a day (BID) | INTRAVENOUS | Status: DC
  Administered 2023-06-05: 3 mL via INTRAVENOUS

## 2023-06-05 MED ORDER — DIPHENHYDRAMINE HCL 25 MG PO CAPS
25.0000 mg | ORAL_CAPSULE | Freq: Four times a day (QID) | ORAL | Status: DC | PRN
  Administered 2023-06-05 (×2): 25 mg via ORAL
  Filled 2023-06-05 (×2): qty 1

## 2023-06-05 MED ORDER — ZOLPIDEM TARTRATE 5 MG PO TABS: 5.0000 mg | ORAL_TABLET | Freq: Every evening | ORAL | Status: DC | PRN

## 2023-06-05 MED ORDER — MEASLES, MUMPS & RUBELLA VAC IJ SOLR: 0.5000 mL | Freq: Once | INTRAMUSCULAR | Status: DC

## 2023-06-05 MED ORDER — LACTATED RINGERS IV SOLN: INTRAVENOUS | Status: DC

## 2023-06-05 NOTE — Progress Notes (Signed)
[name] called to [facility] room 024 for delivery of infant unknown GA.  0331: [facility] Delivery called to PBX Operator.  [name] at bedside, FHR 120s with variables to 50s.   0334: Dr. Donavan Foil called and notified of pt unknown GA with low fundal height, no PNC, crowning.   0335: NICU en route to [facility].   0336: Dr. Lanae Crumbly at bedside. Pt 9cm, verified vertex via bedside ultrasound.   8413: Dr. Donavan Foil and NICU at bedside.   56: SVD of premature female infant. Cord clamped and cut immediately, infant to awaiting NICU team.   [phone]: Spontaneous delivery of placenta, appears intact. Pitocin started per standing order.   0410: Pt transferred to LD room 216 for remainder of recovery.

## 2023-06-05 NOTE — ED Provider Notes (Signed)
[place] EMERGENCY DEPARTMENT AT [facility] - Inpatient Provider Note   CSN: [id number] Arrival date & time: 06/05/23  0303     History  Chief Complaint  Patient presents with   Abdominal Pain    Sylvia Mccann is a 32 y.o. female.  32 y/o G0P0 female presents to the ED for abdominal pain and back pain. Symptoms began yesterday and were initially intermittent. Pain became constant and more severe this evening. No medications taken PTA for pain. Had vaginal discharge yesterday which was c/w mucous with some blood. States she voided on herself involuntarily shortly after arrival. She is sexually active with 1 female partner. Unknown LMP.  The history is provided by the patient and the EMS personnel. No language interpreter was used.  Abdominal Pain      Home Medications Prior to Admission medications   Medication Sig Start Date End Date Taking? Authorizing Provider  albuterol (PROVENTIL HFA;VENTOLIN HFA) 108 (90 Base) MCG/ACT inhaler Inhale 1-2 puffs into the lungs every 6 (six) hours as needed for wheezing or shortness of breath.   Yes [provider]  aspirin EC 81 MG tablet Take 1 tablet (81 mg total) by mouth daily. Swallow whole. 02/12/21  Yes Dean, Corrie Mckusick, MD  benzonatate (TESSALON) 100 MG capsule Take 1 capsule (100 mg total) by mouth every 8 (eight) hours. Patient not taking: Reported on 04/18/2020 11/14/18   Carlyle Basques P, PA-C  cyclobenzaprine (FLEXERIL) 10 MG tablet Take 1 tablet (10 mg total) by mouth 2 (two) times daily as needed for muscle spasms. Patient not taking: Reported on 04/18/2020 12/28/19   Fayrene Helper, PA-C  doxycycline (VIBRA-TABS) 100 MG tablet Take 1 tablet (100 mg total) by mouth 2 (two) times daily. Patient not taking: Reported on 04/18/2020 03/02/20   Linwood Dibbles, MD  fluticasone [facility]) 50 MCG/ACT nasal spray Place 1 spray into both nostrils daily. Patient not taking: Reported on 04/18/2020 11/14/18   Leretha Dykes, PA-C   HYDROcodone-acetaminophen (NORCO/VICODIN) 5-325 MG tablet Take 1 tablet by mouth every 6 (six) hours as needed for severe pain. 02/07/21   Wallis Bamberg, PA-C  ibuprofen (ADVIL) 600 MG tablet Take 1 tablet (600 mg total) by mouth every 6 (six) hours as needed. Patient not taking: Reported on 04/18/2020 12/28/19   Fayrene Helper, PA-C  naproxen (NAPROSYN) 500 MG tablet Take 1 tablet (500 mg total) by mouth 2 (two) times daily with a meal. As needed for pain Patient not taking: Reported on 04/18/2020 03/02/20   Linwood Dibbles, MD  nortriptyline (PAMELOR) 25 MG capsule Take 50 mg by mouth at bedtime. Patient not taking: Reported on 04/18/2020    [provider]  ondansetron (ZOFRAN) 4 MG tablet Take 1 tablet (4 mg total) by mouth every 6 (six) hours. 09/27/21   Lutricia Feil, MD  ondansetron (ZOFRAN) 4 MG tablet Take 1 tablet (4 mg total) by mouth every 6 (six) hours. 09/27/21   Lutricia Feil, MD  predniSONE (DELTASONE) 10 MG tablet Take 4 tablets (40 mg total) by mouth daily. 09/27/21   Lutricia Feil, MD  predniSONE (DELTASONE) 50 MG tablet One tab PO daily for 4 days Patient not taking: Reported on 04/18/2020 05/06/18   Zadie Rhine, MD  tranexamic acid (LYSTEDA) 650 MG TABS tablet Take 2 tablets (1,300 mg total) by mouth 3 (three) times daily. 04/18/20   Malachy Chamber, MD  Vitamin D, Ergocalciferol, (DRISDOL) 1.25 MG (50000 UNIT) CAPS capsule Take 1 capsule (50,000 Units total) by mouth every 7 (seven)  days. 02/24/21   Magnant, Joycie Peek, PA-C      Allergies    Diclofenac potassium(migraine)    Review of Systems   Review of Systems  Unable to perform ROS: Acuity of condition  Gastrointestinal:  Positive for abdominal pain.    Physical Exam Updated Vital Signs BP 130/81 (BP Location: Right Arm)   Pulse 87   Temp 97.9 F (36.6 C) (Oral)   Resp 17   Ht 5\' 5"  (1.651 m)   Wt 90.7 kg   SpO2 100%   BMI 33.28 kg/m   Physical Exam Vitals and nursing note reviewed. Exam conducted  with a chaperone present.  Constitutional:      General: She is not in acute distress.    Appearance: She is well-developed. She is not diaphoretic.     Comments: Agitated, wincing in pain  HENT:     Head: Normocephalic and atraumatic.  Eyes:     General: No scleral icterus.    Extraocular Movements: EOM normal.     Conjunctiva/sclera: Conjunctivae normal.  Cardiovascular:     Rate and Rhythm: Regular rhythm. Tachycardia present.     Pulses: Normal pulses.  Pulmonary:     Effort: Pulmonary effort is normal. No respiratory distress.  Abdominal:     General: There is distension.     Comments: Abdomen soft, obese. Suspected uterine fundal height 2cm above the umbilicus.  Genitourinary:    Comments: Cervix dilated to 9cm. Fetal head palpated. Musculoskeletal:        General: Normal range of motion.     Cervical back: Normal range of motion.  Skin:    General: Skin is warm and dry.     Coloration: Skin is not pale.     Findings: No erythema or rash.  Neurological:     Mental Status: She is alert and oriented to person, place, and time.     Coordination: Coordination normal.     Comments: Moving all extremities spontaneously.  Psychiatric:        Mood and Affect: Mood and affect normal.        Behavior: Behavior normal.     ED Results / Procedures / Treatments   Labs (all labs ordered are listed, but only abnormal results are displayed) Labs Reviewed  CBC WITH DIFFERENTIAL/PLATELET - Abnormal; Notable for the following components:      Result Value   WBC 19.9 (*)    RBC 3.72 (*)    Hemoglobin 10.6 (*)    HCT 33.1 (*)    Neutro Abs 16.1 (*)    Monocytes Absolute 1.4 (*)    Abs Immature Granulocytes 0.21 (*)    All other components within normal limits  COMPREHENSIVE METABOLIC PANEL - Abnormal; Notable for the following components:   Sodium 133 (*)    CO2 21 (*)    Glucose, Bld 110 (*)    BUN <5 (*)    Total Protein 6.4 (*)    Albumin 2.5 (*)    All other components  within normal limits  URINALYSIS, ROUTINE W REFLEX MICROSCOPIC  RAPID URINE DRUG SCREEN, HOSP PERFORMED  HEPATITIS B SURFACE ANTIGEN  RUBELLA SCREEN  RPR  HIV ANTIBODY (ROUTINE TESTING W REFLEX)  RPR  SURGICAL PATHOLOGY    EKG None  Radiology No results found.  Procedures Ultrasound ED OB Pelvic  Date/Time: 06/05/2023 3:25 AM  Performed by: Antony Madura, PA-C Authorized by: Antony Madura, PA-C   Procedure details:    Indications comment:  Abdominal pain,  pelvic pain, unknown LMP   Assess:  EGA and intrauterine pregnancy   Technique: transabdominal obstetric; unknown HCG.   Images: not archived   Study Limitations: body habitus (emergent procedure) Uterine findings:    Single gestation: identified     Fetal heart rate: identified     Estimated gestational age: 25 weeks Comments:     Emergent transabdominal US to determine presence of intrauterine pregnancy in patient with unknown LMP. US revealed low lying head in pelvis, present but decreased fetal cardiac activity .Critical Care  Performed by: Antony Madura, PA-C Authorized by: Antony Madura, PA-C   Critical care provider statement:    Critical care time (minutes):  30   Critical care time was exclusive of:  Separately billable procedures and treating other patients   Critical care was necessary to treat or prevent imminent or life-threatening deterioration of the following conditions: SVD.   Critical care was time spent personally by me on the following activities:  Development of treatment plan with patient or surrogate, discussions with consultants, evaluation of patient's response to treatment, examination of patient, ordering and review of laboratory studies, ordering and review of radiographic studies, ordering and performing treatments and interventions, pulse oximetry, re-evaluation of patient's condition, review of old charts and obtaining history from patient or surrogate   I assumed direction of critical care for  this patient from another provider in my specialty: no     Care discussed with: admitting provider       Medications Ordered in ED Medications  fentaNYL (SUBLIMAZE) 50 MCG/ML injection (  Not Given 06/05/23 0402)  lactated ringers infusion (has no administration in time range)  oxytocin (PITOCIN) IV BOLUS FROM BAG (has no administration in time range)  oxytocin (PITOCIN) IV infusion 30 units in NS 500 mL - Premix (has no administration in time range)  lactated ringers infusion 500-1,000 mL (has no administration in time range)  acetaminophen (TYLENOL) tablet 650 mg (has no administration in time range)  oxyCODONE-acetaminophen (PERCOCET/ROXICET) 5-325 MG per tablet 1 tablet (has no administration in time range)  oxyCODONE-acetaminophen (PERCOCET/ROXICET) 5-325 MG per tablet 2 tablet (has no administration in time range)  fentaNYL (SUBLIMAZE) injection 50-100 mcg (has no administration in time range)  ondansetron (ZOFRAN) injection 4 mg (has no administration in time range)  sodium citrate-citric acid (ORACIT) solution 30 mL (has no administration in time range)  lidocaine (PF) (XYLOCAINE) 1 % injection 30 mL (has no administration in time range)    ED Course/ Medical Decision Making/ A&P Clinical Course as of 06/05/23 0442  Sat Jun 05, 2023  0335 Patient moved to Trauma C and handoff given to OBGYN [KH]    Clinical Course User Index [KH] Antony Madura, PA-C                                 Medical Decision Making Amount and/or Complexity of Data Reviewed Labs: ordered.  Risk Decision regarding hospitalization.   This patient presents to the ED for concern of abdominal pain and back pain, this involves an extensive number of treatment options, and is a complaint that carries with it a high risk of complications and morbidity.  The differential diagnosis includes UTI/pyelonephritis vs ectopic pregnancy vs ovarian torsion vs PID vs SVD   Co morbidities that complicate the patient  evaluation  Migraine  Asthma    Additional history obtained:  Additional history obtained from EMS personnel   Lab Tests:  I Ordered, and personally interpreted labs.  The pertinent results include:  WBC 19.9 (likely reactive from labor), Hgb 10.6.   Imaging Studies:  I personally performed and interpreted a bedside ultrasound to confirm presence of an IUP of advanced gestation.   Cardiac Monitoring:  The patient was maintained on a cardiac monitor.  I personally viewed and interpreted the cardiac monitored which showed an underlying rhythm of: sinus tachycardia   Medicines ordered and prescription drug management:  I have reviewed the patients home medicines and have made adjustments as needed   Test Considered:  Pelvic US, formal - unable to perform due to eminent delivery   Consultations Obtained:  I requested consultation with Rapid Response OB RN and MD Donavan Foil of OBGYN who promptly came to bedside to assist in care of patient during eminent delivery   Problem List / ED Course:  Micah Flesher to assess patient in exam room. She was lying supine and uncomfortable appearing, complaining of constant waxing and waning pain. Reports vaginal mucousy discharge yesterday, unknown LMP. Is sexually active with 1 female partner. Concern for active labor. RN went to retrieve [place] and I performed a bedside US assessment which revealed fetal cardiac activity when scanning the suprapubic abdomen. I next donned sterile gloves and performed a bimanual exam. Patient noted to be significantly dilated; fetal head palpable. Verbal order was given to bedside RN to contact rapid response OB RN and to notify [facility] of patient in the ED in eminent delivery. Patient was subsequently transferred to Trauma C, at which time Rapid response OB RNs were in the department and assessing patient at bedside ~0330. MD Donavan Foil of OBGYN at bedside shortly thereafter and care fully transitioned to [facility]. Baby boy delivered by  OBGYN team at approximately [phone]. EGA ~28 weeks.   Reevaluation:  After the interventions noted above, I reevaluated the patient and found that they have : remained stable   Social Determinants of Health:  Insured patient   Dispostion:  After consideration of the diagnostic results and the patients response to treatment, patient to be admitted to MAU for postpartum monitoring. OBGYN to admit.         Final Clinical Impression(s) / ED Diagnoses Final diagnoses:  SVD (spontaneous vaginal delivery)    Rx / DC Orders ED Discharge Orders     None         Antony Madura, PA-C 06/05/23 0442    Palumbo, April, MD 06/05/23 [phone]

## 2023-06-05 NOTE — H&P (Signed)
LABOR ADMISSION HISTORY AND PHYSICAL  Sylvia Mccann is a 32 y.o. female G1P0 with IUP at Unknown gestational age.  Pt received no prenatal care for the entirety of this pregnancy. Pt admitted to daily alcohol use as well as using cocaine yesterday.  Upon arrival to ED pt was 9 cm dilated and rapidly changed to complete. Bedside ultrasound confirmed vertex position. When crowning the fetal head appeared to be of premature size,  NICU team was available.   Dating: no dating available  Sono:   N/a  Prenatal History/Complications:  Past Medical History: Past Medical History:  Diagnosis Date   Asthma    Migraine     Past Surgical History: Past Surgical History:  Procedure Laterality Date   NO PAST SURGERIES      Obstetrical History: OB History     Gravida  1   Para      Term      Preterm      AB      Living         SAB      IAB      Ectopic      Multiple      Live Births              Social History: Social History   Socioeconomic History   Marital status: Single    Spouse name: Not on file   Number of children: Not on file   Years of education: Not on file   Highest education level: Not on file  Occupational History   Not on file  Tobacco Use   Smoking status: Every Day    Current packs/day: 0.50    Types: Cigarettes   Smokeless tobacco: Never  Substance and Sexual Activity   Alcohol use: No   Drug use: No   Sexual activity: Yes    Birth control/protection: None  Other Topics Concern   Not on file  Social History Narrative   Not on file   Social Determinants of Health   Financial Resource Strain: Not on file  Food Insecurity: Not on file  Transportation Needs: Not on file  Physical Activity: Not on file  Stress: Not on file  Social Connections: Not on file    Family History: No family history on file.  Allergies: Allergies  Allergen Reactions   Diclofenac Potassium(Migraine) Other (See Comments)    'MADE ME FEEL LIKE MY HEAD  WAS ON FIRE"    Medications Prior to Admission  Medication Sig Dispense Refill Last Dose   albuterol (PROVENTIL HFA;VENTOLIN HFA) 108 (90 Base) MCG/ACT inhaler Inhale 1-2 puffs into the lungs every 6 (six) hours as needed for wheezing or shortness of breath.   Past Week   aspirin EC 81 MG tablet Take 1 tablet (81 mg total) by mouth daily. Swallow whole. 30 tablet 11 Past Week   benzonatate (TESSALON) 100 MG capsule Take 1 capsule (100 mg total) by mouth every 8 (eight) hours. (Patient not taking: Reported on 04/18/2020) 21 capsule 0    cyclobenzaprine (FLEXERIL) 10 MG tablet Take 1 tablet (10 mg total) by mouth 2 (two) times daily as needed for muscle spasms. (Patient not taking: Reported on 04/18/2020) 20 tablet 0    doxycycline (VIBRA-TABS) 100 MG tablet Take 1 tablet (100 mg total) by mouth 2 (two) times daily. (Patient not taking: Reported on 04/18/2020) 14 tablet 0    fluticasone (FLONASE) 50 MCG/ACT nasal spray Place 1 spray into both nostrils daily. (Patient not  taking: Reported on 04/18/2020) 9.9 g 0    HYDROcodone-acetaminophen (NORCO/VICODIN) 5-325 MG tablet Take 1 tablet by mouth every 6 (six) hours as needed for severe pain. 15 tablet 0    ibuprofen (ADVIL) 600 MG tablet Take 1 tablet (600 mg total) by mouth every 6 (six) hours as needed. (Patient not taking: Reported on 04/18/2020) 30 tablet 0    naproxen (NAPROSYN) 500 MG tablet Take 1 tablet (500 mg total) by mouth 2 (two) times daily with a meal. As needed for pain (Patient not taking: Reported on 04/18/2020) 20 tablet 0    nortriptyline (PAMELOR) 25 MG capsule Take 50 mg by mouth at bedtime. (Patient not taking: Reported on 04/18/2020)      ondansetron (ZOFRAN) 4 MG tablet Take 1 tablet (4 mg total) by mouth every 6 (six) hours. 12 tablet 0    ondansetron (ZOFRAN) 4 MG tablet Take 1 tablet (4 mg total) by mouth every 6 (six) hours. 12 tablet 0    predniSONE (DELTASONE) 10 MG tablet Take 4 tablets (40 mg total) by mouth daily. 15 tablet 0     predniSONE (DELTASONE) 50 MG tablet One tab PO daily for 4 days (Patient not taking: Reported on 04/18/2020) 4 tablet 0    tranexamic acid (LYSTEDA) 650 MG TABS tablet Take 2 tablets (1,300 mg total) by mouth 3 (three) times daily. 30 tablet 3    Vitamin D, Ergocalciferol, (DRISDOL) 1.25 MG (50000 UNIT) CAPS capsule Take 1 capsule (50,000 Units total) by mouth every 7 (seven) days. 6 capsule 0      Review of Systems   All systems reviewed and negative except as stated in HPI  Blood pressure 130/81, pulse 87, temperature 97.9 F (36.6 C), temperature source Oral, resp. rate 17, height 5\' 5"  (1.651 m), weight 90.7 kg, SpO2 100%. General appearance: alert, cooperative, distracted, moderate distress, and moderately obese Lungs: clear to auscultation bilaterally Heart: regular rate and rhythm Abdomen: soft, non-tender; bowel sounds normal Pelvic: no membranes detected, fetal head crowning Extremities: Homans sign is negative, no sign of DVT Presentation: cephalic Fetal monitoring fetal heart visually 120-130s on bedside ultrasound Uterine activity: unable to be monitored in the ER     Prenatal labs: All pending due to no prenatal care  Prenatal Transfer Tool  Maternal Diabetes: No Genetic Screening: not done Maternal Ultrasounds/Referrals: Other:none performed Fetal Ultrasounds or other Referrals:  None Maternal Substance Abuse:  Yes:  Type: Cocaine, Other: alcohol Significant Maternal Medications:  None Significant Maternal Lab Results: None  Results for orders placed or performed during the hospital encounter of 06/05/23 (from the past 24 hour(s))  CBC with Differential   Collection Time: 06/05/23  3:24 AM  Result Value Ref Range   WBC 19.9 (H) 4.0 - 10.5 K/uL   RBC 3.72 (L) 3.87 - 5.11 MIL/uL   Hemoglobin 10.6 (L) 12.0 - 15.0 g/dL   HCT 16.1 (L) 09.6 - 04.5 %   MCV 89.0 80.0 - 100.0 fL   MCH 28.5 26.0 - 34.0 pg   MCHC 32.0 30.0 - 36.0 g/dL   RDW 40.9 81.1 - 91.4 %    Platelets 344 150 - 400 K/uL   nRBC 0.0 0.0 - 0.2 %   Neutrophils Relative % 81 %   Neutro Abs 16.1 (H) 1.7 - 7.7 K/uL   Lymphocytes Relative 10 %   Lymphs Abs 2.0 0.7 - 4.0 K/uL   Monocytes Relative 7 %   Monocytes Absolute 1.4 (H) 0.1 - 1.0 K/uL  Eosinophils Relative 1 %   Eosinophils Absolute 0.2 0.0 - 0.5 K/uL   Basophils Relative 0 %   Basophils Absolute 0.1 0.0 - 0.1 K/uL   Immature Granulocytes 1 %   Abs Immature Granulocytes 0.21 (H) 0.00 - 0.07 K/uL  Comprehensive metabolic panel   Collection Time: 06/05/23  3:24 AM  Result Value Ref Range   Sodium 133 (L) 135 - 145 mmol/L   Potassium 3.5 3.5 - 5.1 mmol/L   Chloride 102 98 - 111 mmol/L   CO2 21 (L) 22 - 32 mmol/L   Glucose, Bld 110 (H) 70 - 99 mg/dL   BUN <5 (L) 6 - 20 mg/dL   Creatinine, Ser 8.29 0.44 - 1.00 mg/dL   Calcium 9.1 8.9 - 56.2 mg/dL   Total Protein 6.4 (L) 6.5 - 8.1 g/dL   Albumin 2.5 (L) 3.5 - 5.0 g/dL   AST 22 15 - 41 U/L   ALT 21 0 - 44 U/L   Alkaline Phosphatase 123 38 - 126 U/L   Total Bilirubin 0.5 0.3 - 1.2 mg/dL   GFR, Estimated >13 >08 mL/min   Anion gap 10 5 - 15    Patient Active Problem List   Diagnosis Date Noted   SVD (spontaneous vaginal delivery) 06/05/2023   Major depressive disorder, single episode, severe without psychotic features (HCC) 10/22/2017   Major depressive disorder, single episode, severe without psychosis (HCC) 10/22/2017    Assessment: Luelle Kulesa is a 32 y.o. G1P0 at Unknown gestational age now s/p precipitous delivery in the ER trauma bay.  Cord gas was taken in the ER,. See separate note for delivery summary. Will obtain no prenatal care labs and UDS. Social work consult in the postpartum preriod. Pt to specialty care due to infant NICU status,  Warden Fillers, MD 06/05/2023, 4:40 AM

## 2023-06-05 NOTE — ED Triage Notes (Signed)
Pt from home by ambulance. C/o severe abd pain 10/10,rigid tender,distended. Pink/clear/white vaginal discharge, pt now incontinent of urine. States she thinks she is having a miscarriage. Ana do x 4. Skin warm and dry

## 2023-06-05 NOTE — ED Notes (Signed)
Patient currently pushing.

## 2023-06-05 NOTE — Discharge Summary (Signed)
Postpartum Discharge Summary  Date of Service updated: 06/07/23     Patient Name: Sylvia Mccann DOB: [birth year] MRN: [id number]  Date of admission: 06/05/2023 Delivery date:06/05/2023 Delivering provider: Myrtie Hawk Date of discharge: 06/07/2023  Admitting diagnosis: SVD (spontaneous vaginal delivery) [O80] Intrauterine pregnancy: Unknown     Secondary diagnosis:  Principal Problem:   Preterm delivery Active Problems:   Major depressive disorder, single episode, severe without psychotic features (HCC)   Cocaine use complicating pregnancy   Alcohol use affecting pregnancy  Additional problems: none    Discharge diagnosis: Preterm Pregnancy Delivered                                              Post partum procedures: n/a Augmentation: N/A Complications: None  Hospital course: Onset of Labor With Vaginal Delivery      32 y.o. yo G1P1 at Unknown was admitted in Active Labor on 06/05/2023. Labor course was complicated by precipitous delivery in the emergency department as well as no prenatal care.  The patient did not know she was pregnant and admitted to cocaine and alcohol use during her pregnancy. Membrane Rupture Time/Date:  , Unknown Delivery Method:Vaginal, Spontaneous Operative Delivery:N/A Episiotomy: None Lacerations:    Patient had an uncomplicated postpartum course .  She is ambulating, tolerating a regular diet, passing flatus, and urinating well. Patient is discharged home in stable condition on 06/07/23.  Newborn Data: Birth date:06/05/2023 Birth time:3:44 AM Gender:Female Living status:Living Apgars:4 ,8  Weight:870 g  Magnesium Sulfate received: No BMZ received: No Rhophylac:N/A MMR:No T-DaP: not given Flu: N/A Transfusion:No  Physical exam  Vitals:   06/06/23 0759 06/06/23 1712 06/06/23 1925 06/07/23 0820  BP: (!) 104/58 108/62 122/78 125/88  Pulse: 77 76 79 93  Resp: 18 16 17 18   Temp: 97.7 F (36.5 C) 97.6 F (36.4 C) 98.1 F (36.7 C)  98 F (36.7 C)  TempSrc: Oral Oral Oral Oral  SpO2: 98% 100% 100% 96%  Weight:      Height:       General: alert, cooperative, and no distress Lochia: appropriate Uterine Fundus: firm Incision: N/A DVT Evaluation: No evidence of DVT seen on physical exam. Negative Homan's sign. Labs: Lab Results  Component Value Date   WBC 19.9 (H) 06/05/2023   HGB 10.6 (L) 06/05/2023   HCT 33.1 (L) 06/05/2023   MCV 89.0 06/05/2023   PLT 344 06/05/2023      Latest Ref Rng & Units 06/05/2023    3:24 AM  CMP  Glucose 70 - 99 mg/dL 474   BUN 6 - 20 mg/dL <5   Creatinine 2.59 - 1.00 mg/dL 5.63   Sodium 875 - 643 mmol/L 133   Potassium 3.5 - 5.1 mmol/L 3.5   Chloride 98 - 111 mmol/L 102   CO2 22 - 32 mmol/L 21   Calcium 8.9 - 10.3 mg/dL 9.1   Total Protein 6.5 - 8.1 g/dL 6.4   Total Bilirubin 0.3 - 1.2 mg/dL 0.5   Alkaline Phos 38 - 126 U/L 123   AST 15 - 41 U/L 22   ALT 0 - 44 U/L 21    Edinburgh Score:    06/05/2023    9:00 PM  Edinburgh Postnatal Depression Scale Screening Tool  I have been able to laugh and see the funny side of things. 0  I have looked forward with enjoyment  to things. 1  I have blamed myself unnecessarily when things went wrong. 2  I have been anxious or worried for no good reason. 2  I have felt scared or panicky for no good reason. 2  Things have been getting on top of me. 1  I have been so unhappy that I have had difficulty sleeping. 2  I have felt sad or miserable. 2  I have been so unhappy that I have been crying. 1  The thought of harming myself has occurred to me. 1  Edinburgh Postnatal Depression Scale Total 14     After visit meds:  Allergies as of 06/07/2023       Reactions   Diclofenac Potassium(migraine) Other (See Comments)   'MADE ME FEEL LIKE MY HEAD WAS ON FIRE"        Medication List     STOP taking these medications    aspirin EC 81 MG tablet   benzonatate 100 MG capsule Commonly known as: TESSALON   cyclobenzaprine 10 MG  tablet Commonly known as: FLEXERIL   doxycycline 100 MG tablet Commonly known as: VIBRA-TABS   fluticasone 50 MCG/ACT nasal spray Commonly known as: Flonase   HYDROcodone-acetaminophen 5-325 MG tablet Commonly known as: NORCO/VICODIN   naproxen 500 MG tablet Commonly known as: Naprosyn   nortriptyline 25 MG capsule Commonly known as: PAMELOR   ondansetron 4 MG tablet Commonly known as: ZOFRAN   predniSONE 10 MG tablet Commonly known as: DELTASONE   predniSONE 50 MG tablet Commonly known as: DELTASONE   tranexamic acid 650 MG Tabs tablet Commonly known as: Lysteda   Vitamin D (Ergocalciferol) 1.25 MG (50000 UNIT) Caps capsule Commonly known as: DRISDOL       TAKE these medications    albuterol 108 (90 Base) MCG/ACT inhaler Commonly known as: VENTOLIN HFA Inhale 1-2 puffs into the lungs every 6 (six) hours as needed for wheezing or shortness of breath.   ibuprofen 600 MG tablet Commonly known as: ADVIL Take 1 tablet (600 mg total) by mouth every 6 (six) hours. What changed:  when to take this reasons to take this         Discharge home in stable condition Infant Feeding: Breast Infant Disposition:NICU Discharge instruction: per After Visit Summary and Postpartum booklet. Activity: Advance as tolerated. Pelvic rest for 6 weeks.  Diet: routine diet Future Appointments:routine postpartum in 4-6 weeks Will try to schedule with Jene Every with behavorial health Follow up Visit:  Follow-up Information     [facility]. Schedule an appointment as soon as possible for a visit in 4 week(s).   Specialty: Obstetrics and Gynecology Why: postpartum appointment Contact information: [address] [place] [address] [phone]               Message sent to [facility] on 8/10  Please schedule this patient for a In person postpartum visit in 6 weeks with the following provider: Any  provider. Additional Postpartum F/U:Postpartum Depression checkup  High risk pregnancy complicated by:  No prenatal care, cocaine use, alcohol use, depression Delivery mode:  Vaginal, Spontaneous Anticipated Birth Control:  Unsure   06/07/2023 Warden Fillers, MD

## 2023-06-05 NOTE — ED Notes (Signed)
Patient transferred to Tra C for emergent vaginal delivery.

## 2023-06-05 NOTE — ED Notes (Signed)
Baby boy delivered 70.

## 2023-06-06 ENCOUNTER — Encounter (HOSPITAL_COMMUNITY): Payer: Self-pay | Admitting: Obstetrics and Gynecology

## 2023-06-06 LAB — RPR: RPR Ser Ql: NONREACTIVE

## 2023-06-06 NOTE — Clinical Social Work Maternal (Signed)
CLINICAL SOCIAL WORK MATERNAL/CHILD NOTE  Patient Details  Name: Sylvia Mccann MRN: [id number] Date of Birth: 02-02-1991  Date:  06/06/2023  Clinical Social Worker Initiating Note:  Albertine Patricia, LCSWA Date/Time: Initiated:  06/06/23/1726     Child's Name:  Cleotilde Neer   Biological Parents:  Mother   Need for Interpreter:  None   Reason for Referral:  Current Substance Use/Substance Use During Pregnancy  , Late or No Prenatal Care  , Behavioral Health Concerns   Address: [address] [place], [place] [address]   Phone number: [phone]  Additional phone number: [phone]  Household Members/Support Persons (HM/SP):   Household Member/Support Person 1, Household Member/Support Person 2, Household Member/Support Person 3   HM/SP Name Relationship DOB or Age  HM/SP -1 Derome Product manager "Support person"/friend 52  HM/SP -2 Ardyth Gal Derome's mother 11  HM/SP -3 Kerry Hough Derome's son 30  HM/SP -4        HM/SP -5        HM/SP -6        HM/SP -7        HM/SP -8          Natural Supports (not living in the home):  Friends   Professional Supports: None   Employment: Part-time   Type of Work: Marketing executive:  [employer] school graduate   Homebound arranged:    Architect:  Media planner , Medicaid   Other Resources:   (WIC referral placed during consult)   Cultural/Religious Considerations Which May Impact Care:  Per [employer], MOB identifies as Curator.  Strengths:    Eager to comply with treatment plan, expressed desire to stop using illicit substances.  Psychotropic Medications:         Pediatrician:     Undecided, list provided.  Pediatrician List:   [facility]      Pediatrician Fax Number:    Risk Factors/Current Problems:  Substance Use  , Mental Health Concerns  , Basic Needs  , Transportation     Cognitive  State:  Able to Concentrate  , Alert  , Goal Oriented  , Linear Thinking     Mood/Affect:  Comfortable  , Interested  , Calm     CSW Assessment: CSW was consulted due to no prenatal care, resource needs, history of depression with suicidal ideation, and [place] Postnatal Depression Scale score of 14 with a positive answer to thoughts of self harm. CSW met with MOB at bedside to complete assessment. When CSW entered room, MOB was observed sitting in hospital bed. CSW introduced self and explained reason for consult. MOB presented as calm, was agreeable to consult and remained engaged throughout encounter.   CSW reviewed demographic information listed on file. MOB declined to share information about FOB. MOB reports her address and phone number listed on file is not current. MOB reports she resides at [address] [place], [place] [address] with her "support person"/friend and his mother and adult child. MOB provided [phone] and [phone] as current phone numbers.  CSW inquired how MOB is feeling emotionally since infant's delivery. MOB was forthcoming in her responses and shared that she did not know that she was pregnant so she initially felt shocked. MOB expressed feelings of guilt about using cocaine and drinking alcohol during pregnancy. CSW provided active listening and emotional support. MOB states  that she has felt excited about infant and is eager to start preparing for his care.   CSW inquired about MOB's mental health history. MOB confirms a diagnosis of depression, stating that she was first diagnosed in early adulthood. MOB reports that over the past year, she has endorsed feelings of depression off and on, marked by crying, feelings of sadness, and an increased and decreased appetite. CSW inquired about history of suicidal ideation noted in MOB's chart review. MOB states that her friend had her admitted to a psychiatric inpatient unit in 2018 and she denies that she endorsed feelings of  suicidal ideation at the time. MOB states that she has endorsed thoughts of not wanting to be alive but has not had a plan in the past. MOB states the last time she endorsed passive suicidal ideation was 1 year ago. CSW reviewed MOB's Edinburgh Postnatal Depression Scale scores and inquired about MOB answering "hardly ever" to question 20 ("The thought of harming myself has occurred to me"). MOB states that she thought the question was asking if she has ever endorsed that thought and denied endorsing SI over the past week. MOB did admit to recent feelings of depression, sharing that she wonders how much her mood was influenced by hormones know that she is aware that she had been pregnant. MOB also shared that she has had financial stressors recently, marked by difficulty purchasing food and transportation barriers which have led to feelings of stress. MOB states she does not currently receive mental health treatment (she does not take psychotropic medication or attend therapy). MOB declined outpatient mental health resources at this time but was agreeable to crisis resources, which CSW provided. MOB denied current SI/HI/DV. CSW identified her roommate, Derome, a friend who lives in [place] who is coming to visit her, other friends, and family who resides in [place] as supports.   CSW provided education regarding the baby blues period vs. perinatal mood disorders, discussed treatment and gave resources for mental health follow up if concerns arise.  CSW recommends self-evaluation during the postpartum time period using the New Mom Checklist from Postpartum Progress and encouraged MOB to contact a medical professional if symptoms are noted at any time.    CSW inquired about resource needs. MOB states that she does not have items for infant, as she did not know she was pregnant. CSW provided [employer] resource and placed a [facility] referral with MOB's verbal consent. MOB states that she applied for food stamps about 2  weeks ago but was denied. CSW encouraged MOB to reapply now that she can include infant in her application. CSW was agreeable to additional food bank resources, which CSW provided. MOB states that she can return to live with her friend after discharge but inquired about housing resources to secure her own residence. CSW provided MOB with housing resources. CSW inquired about transportation barriers. MOB states that she uses the bus and her friend who is visiting from [place] will be able to provide transportation when she is discharged. MOB explained that her friend with whom she resides has a vehicle but the starter needs to be replaced. MOB states that she plans on having the car repaired prior to infant being discharged from the NICU. CSW also provided MOB with Medicaid transportation information.   CSW inquired about Medicaid coverage, sharing that she believes that she only has family planning. CSW offered to notify [facility] Financial Navigator of [employer] insurance coverage questions via email. MOB expressed appreciation, Financial Navigator to  follow up.  MOB states she is undecided on a pediatrician for infant's follow up care. CSW provided MOB with a list.   CSW and MOB discussed infant's NICU admission. CSW informed MOB about the NICU, what to expect, and resources/supports available while infant is admitted to the NICU. MOB reported that she feels updated about infant's care. MOB shared that meal vouchers and gas cards would be helpful while infant is in the NICU. CSW also provided information about [employer]. MOB expressed interest in a referral. CSW placed referral to [employer]. CSW also explained that the hospital can provide a car seat if in stock for $30 at the time of infant's discharge if all other resources have been exhausted.   CSW explained that infant is eligible for social security income benefits due to his birth weight. CSW provided instructions on how to apply for SSI.  CSW  informed MOB about hospital drug screen policy due to no prenatal care. CSW explained that infant's UDS resulted positive for cocaine and a CPS report would be made. CSW explained that infant's UDS would also continue to be monitored. MOB expressed understanding. CSW inquired about substance use/barriers to prenatal care during pregnancy. As noted above, MOB reports that she was not aware that she was pregnant until she was in labor. MOB reports daily alcohol use during pregnancy, marked by 1-2 alcoholic seltzers per day. MOB also reports that she smoked cocaine on average 1 time per week during her pregnancy and smoked marijuana 2 times per week. MOB denied using other illicit substances during pregnancy. MOB also shared that she smoked about half a pack of cigarettes per day during pregnancy. MOB expressed remorse about using illicit substances, drinking alcohol, and smoking cigarettes during her pregnancy. CSW inquired if MOB would like substance use treatment resources, MOB declined sharing that she plans to stop using illicit substances and feels that she does not need additional support to do so at this time.   CSW placed call to [facility] After Hours Department of Social Services CPS line and made CPS report to [facility] On Call CPS social worker, Johnathan Hausen due to no prenatal care, infant's UDS testing positive for cocaine, and alcohol use during pregnancy.   Review of Sudden Infant Death Syndrome (SIDS) precautions to be completed closer to the time of infant's discharge.   CSW will continue to offer support and resources to family while infant remains in NICU. CSW awaiting CPS disposition plan.   CSW Plan/Description:  Sudden Infant Death Syndrome (SIDS) Education, Neonatal Abstinence Syndrome (NAS) Education, Special educational needs teacher Income (SSI) Information, Other Information/Referral to [employer], CSW Awaiting CPS Disposition Plan, Psychosocial Support and Ongoing  Assessment of Needs, Perinatal Mood and Anxiety Disorder (PMADs) Education, Hospital Drug Screen Policy Information, Child Protective Service Report  , CSW Will Continue to Monitor Umbilical Cord Tissue Drug Screen Results and Make Report if Reggie Pile, LCSWA 06/06/2023, 5:36 PM

## 2023-06-06 NOTE — Lactation Note (Signed)
This note was copied from a baby'Mccann chart.  NICU Lactation Consultation Note  Patient Name: Sylvia Mccann ZOXWR'U Date: 06/06/2023 Age:31 hours  Reason for consult: Initial assessment; NICU baby; Infant < 6lbs; Primapara; 1st time breastfeeding; Preterm <34wks; Other (Comment) (no PNC, prenatal alcohol use, baby'Mccann UDS (+) cocaine)  SUBJECTIVE Visited with family of 50 hours old pre-term NICU female; Sylvia Mccann is a P1 and reports she started pumping yesterday and already getting drops, praised her for her efforts. She'Mccann been pumping and dumping due to polysubstance use during the pregnancy (she didn't know she was pregnant), her plan is to keep pumping to protect her supply until the provider clears her out to start using her breastmilk. Assisted with breast massage and hand expression, colostrum easily expressed. Reviewed pumping schedule, pump settings, lactogenesis II and anticipatory guidelines.   OBJECTIVE Infant data: Mother'Mccann Current Feeding Choice: Breast Milk and Donor Milk  Maternal data: G1P1 Vaginal, Spontaneous Has patient been taught Hand Expression?: Yes Hand Expression Comments: colostrum easily expressed Significant Breast History:: moderate breast changes during the pregnancy Current breast feeding challenges:: NICU admission Does the patient have breastfeeding experience prior to this delivery?: No Pumping frequency: 4 times/24 hours Pumped volume: 0 mL (drops) Flange Size: 21 Risk factor for low milk supply:: primipara, prematurity  WIC Program: Yes WIC Referral Sent?: Yes What county?: [place]  ASSESSMENT Infant: Feeding Status: NPO  Maternal: Milk volume: Normal  INTERVENTIONS/PLAN Interventions: Interventions: Breast feeding basics reviewed; Breast massage; Hand express; DEBP; Education; [employer] brochure; Coconut oil Tools: Pump; Flanges; Coconut oil Pump Education: Setup, frequency, and cleaning; Milk Storage  Plan: Encouraged pumping and  dumping every 3 hours, ideally 8 pumping sessions/24 hours Breast massage, hand expression and coconut oil were also encouraged prior pumping  No other support person at this time. All questions and concerns answered, family to contact [facility] services PRN.  Consult Status: NICU follow-up NICU Follow-up type: New admission follow up   Sylvia Mccann Sylvia Mccann 06/06/2023, 1:59 PM

## 2023-06-06 NOTE — Progress Notes (Signed)
POSTPARTUM PROGRESS NOTE  Post Partum Day 1  Subjective:  Sylvia Mccann is a 32 y.o. G1P0 s/p SVD at Unknown gestational age.  She reports she is doing well. No acute events overnight. She denies any problems with ambulating, voiding or po intake. Denies nausea or vomiting.  Pain is well controlled.  Lochia is normal.  Objective: Blood pressure 116/76, pulse 86, temperature 98 F (36.7 C), temperature source Oral, resp. rate 18, height 5\' 5"  (1.651 m), weight 90.7 kg, SpO2 100%.  Physical Exam:  General: alert, cooperative and no distress Chest: no respiratory distress, CTA bilaterally Heart:regular rate and rhythm Abdomen: soft, nontender, nondistended Uterine Fundus: firm, appropriately tender DVT Evaluation: No calf swelling or tenderness Extremities: trace edema Skin: warm, dry  Recent Labs    06/05/23 0324  HGB 10.6*  HCT 33.1*    Assessment/Plan: Sylvia Mccann is a 32 y.o. G1P0 s/p SVD    PPD#1 - Doing well  Routine postpartum care Pt is awaiting Social work consult for no PNC and substance use Contraception: unknown Feeding: wants to breastfeed, advised by lactation to pump and dump for now Dispo: continue postpartum care, possible PM discharge if seen by Social work   LOS: 1 day   Mariel Aloe, MD Faculty attending 06/06/2023, 7:48 AM

## 2023-06-07 ENCOUNTER — Other Ambulatory Visit (HOSPITAL_COMMUNITY): Payer: Self-pay

## 2023-06-07 ENCOUNTER — Telehealth: Payer: Self-pay | Admitting: Clinical

## 2023-06-07 DIAGNOSIS — Z452 Encounter for adjustment and management of vascular access device: Secondary | ICD-10-CM | POA: Diagnosis not present

## 2023-06-07 DIAGNOSIS — Z4682 Encounter for fitting and adjustment of non-vascular catheter: Secondary | ICD-10-CM | POA: Diagnosis not present

## 2023-06-07 MED ORDER — IBUPROFEN 600 MG PO TABS
600.0000 mg | ORAL_TABLET | Freq: Four times a day (QID) | ORAL | 0 refills | Status: DC
  Filled 2023-06-07: qty 30, 8d supply, fill #0

## 2023-06-07 NOTE — Telephone Encounter (Signed)
Attempt call regarding referral; Left HIPPA-compliant message to call back Jamie from [facility] at  [phone] (Jamie's office).    

## 2023-06-08 ENCOUNTER — Ambulatory Visit (HOSPITAL_COMMUNITY): Payer: Self-pay

## 2023-06-08 DIAGNOSIS — Z452 Encounter for adjustment and management of vascular access device: Secondary | ICD-10-CM | POA: Diagnosis not present

## 2023-06-08 LAB — RUBELLA SCREEN: Rubella: 1.09 {index} (ref >0.99)

## 2023-06-08 LAB — SURGICAL PATHOLOGY

## 2023-06-08 NOTE — Lactation Note (Signed)
This note was copied from a baby's chart.  NICU Lactation Consultation Note  Patient Name: Boy Kenidy Sandal [id number] Date: 06/08/2023 Age:31 days  Reason for consult: Follow-up assessment; NICU baby; Primapara; 1st time breastfeeding; Infant < 6lbs; Preterm <34wks; Other (Comment); Maternal discharge (No PNC, polysubstance use, baby's UDS (+) cocaine, pump and dump)  SUBJECTIVE Visited with family of 54 hours old pre-term NICU female; Ms. Culliver is a P1 and reports she continues pumping, trying to follow the 3-hour schedule but it's been challenging. Her supply has started to increase, praised her for her efforts. She got discharged yesterday. Reviewed discharge education and the importance of consistent pumping for the onset of lactogenesis II and the prevention of engorgement. She doesn't have a pump at home, [facility] referral sent on admission but she voiced her [facility] appt is not till 06/21/2023. Offered a [facility] loaner pump but she politely declined, she voiced she prefers to have her own; her current insurance [employer] is not eligible for a Stork pump through the hospital. She said she'll continue using her hand pump at home until she gets her pump from the [facility] office. Provided breast pads per her request. Reviewed pumping schedule, lactogenesis II/III and anticipatory guidelines.  OBJECTIVE Infant data: Mother's Current Feeding Choice: -- (Donor milk)  Infant feeding assessment No data recorded  Maternal data: G1P1 Vaginal, Spontaneous Pumping frequency: 5-6 times/24 hours Pumped volume: 10 mL Flange Size: 21  WIC Program: Yes WIC Referral Sent?: Yes What county?: [place]  ASSESSMENT Infant: Feeding Status: Scheduled 8-11-2-5  Maternal: Milk volume: Normal  INTERVENTIONS/PLAN Interventions: Interventions: Breast feeding basics reviewed; Coconut oil; DEBP; Education Discharge Education: Engorgement and breast care Tools: Pump; Flanges; Coconut oil; Other (comment) (Breast  pads) Pump Education: Setup, frequency, and cleaning; Milk Storage  Plan: Encouraged to continue pumping and dumping every 3 hours, ideally 8 pumping sessions/24 hours She'll switch her pump settings from initiate to maintain mode once she starts getting 20 ml of EBM combined She'll contact the [facility] office to P/U her pump ASAP   Female visitor present. All questions and concerns answered, family to contact [facility] services PRN.   Consult Status: NICU follow-up NICU Follow-up type: Verify absence of engorgement; Verify onset of copious milk   Franck Vinal S Doreena Maulden 06/08/2023, 3:55 PM

## 2023-06-09 DIAGNOSIS — J984 Other disorders of lung: Secondary | ICD-10-CM | POA: Diagnosis not present

## 2023-06-09 DIAGNOSIS — Z452 Encounter for adjustment and management of vascular access device: Secondary | ICD-10-CM | POA: Diagnosis not present

## 2023-06-09 NOTE — BH Specialist Note (Signed)
Integrated Behavioral Health Initial In-Person Visit  MRN: [id number] Name: Sylvia Mccann  Number of Integrated Behavioral Health Clinician visits: 1- Initial Visit  Session Start time: 1330    Session End time: 1433  Total time in minutes: 63   Types of Service: Individual psychotherapy  Interpretor:No. Interpretor Name and Language: n/a   Warm Hand Off Completed.        Subjective: Sylvia Mccann is a 32 y.o. female accompanied by  n/a Patient was referred by Merian Capron, MD for history of depression. Patient reports the following symptoms/concerns: Adjusting to and processing unexpected new motherhood; found out about positive pregnancy while in active labor in the ED, followed by baby in NICU and CPS involvement due to substance use in unknown pregnancy. Pt has "cold-turkeyed" all substances postpartum except for tobacco. Pt is open to any resources available to get things ready for baby before he comes home from NICU; working 3 hours/day now to save money; hopes to be able to take time off to spend time with baby upon his discharge.  Duration of problem: Less than 3 weeks postpartum; Severity of problem: moderate  Objective: Mood: Anxious and Affect: Appropriate Risk of harm to self or others: No plan to harm self or others  Life Context: Family and Social: Pt in stable housing with very supportive friends; pt's mother is a "ward of the state" in [place] due to schizophrenia and bipolar disorder; pt lost her father to suicide years ago, he also experienced schizophrenia School/Work: Working 3 hours/day as caretaker Self-Care: Motivated to improve self-care and health Life Changes: Found out about positive pregnancy in active labor in emergency department  Patient and/or Family's Strengths/Protective Factors: Social connections and Sense of purpose  Goals Addressed: Patient will: Reduce symptoms of: anxiety, depression, and stress Increase knowledge and/or ability of:  healthy habits and stress reduction  Demonstrate ability to: Increase healthy adjustment to current life circumstances, Increase adequate support systems for patient/family, Increase motivation to adhere to plan of care, and Decrease self-medicating behaviors  Progress towards Goals: Ongoing  Interventions: Interventions utilized: Solution-Focused Strategies, Psychoeducation and/or Health Education, Link to [employer], and Supportive Reflection  Standardized Assessments completed: GAD-7 and PHQ 9  Patient and/or Family Response: Patient agrees with treatment plan.   Patient Centered Plan: Patient is on the following Treatment Plan(s):  IBH  Assessment: Patient currently experiencing Adjustment disorder with mixed anxious and depressed mood; Psychosocial stress.   Patient may benefit from psychoeducation and brief therapeutic interventions regarding coping with symptoms of depression, anxiety, life stress .  Plan: Follow up with behavioral health clinician on : Two weeks Behavioral recommendations:  -Continue prioritizing healthy self-care (regular meals, adequate rest; allowing practical help from supportive friends and family) until at least postpartum medical appointment -Consider new mom support group as needed at either www.postpartum.net or www.conehealthybaby.com  -Accept referral to [employer] today -Take home condoms today  -Continue working with NICU social worker(s), CPS case Production designer, theatre/television/film and health department worker to obtain baby items and put plan in place after baby discharges from NICU -Continue discontinuing all substances (cocaine, alcohol, etc.) postpartum except tobacco; continue plan to look up NCQuitline information after obtaining new phone -Consider obtaining reduced-fare [place] bus ID, as discussed -Continue plan to reapply for EBT -Continue plan to apply for social security disability benefits for baby Referral(s): Integrated Art gallery manager  (In Clinic) and [employer]:  Food and Transportation  [address] [place], [place]     06/23/2023    4:30  PM 04/18/2020    1:46 PM  Depression screen PHQ 2/9  Decreased Interest 0 3  Down, Depressed, Hopeless 1 2  PHQ - 2 Score 1 5  Altered sleeping 1 2  Tired, decreased energy 2 3  Change in appetite 2 3  Feeling bad or failure about yourself  2 2  Trouble concentrating 2 3  Moving slowly or fidgety/restless 3 0  Suicidal thoughts 0 0  PHQ-9 Score 13 18      06/23/2023    4:31 PM 04/18/2020    1:47 PM  GAD 7 : Generalized Anxiety Score  Nervous, Anxious, on Edge 1 3  Control/stop worrying 2 3  Worry too much - different things 2 3  Trouble relaxing 2 0  Restless 2 0  Easily annoyed or irritable 3 3  Afraid - awful might happen 1 0  Total GAD 7 Score 13 12

## 2023-06-11 DIAGNOSIS — J984 Other disorders of lung: Secondary | ICD-10-CM | POA: Diagnosis not present

## 2023-06-11 DIAGNOSIS — Z452 Encounter for adjustment and management of vascular access device: Secondary | ICD-10-CM | POA: Diagnosis not present

## 2023-06-12 DIAGNOSIS — Z4682 Encounter for fitting and adjustment of non-vascular catheter: Secondary | ICD-10-CM | POA: Diagnosis not present

## 2023-06-12 DIAGNOSIS — Z452 Encounter for adjustment and management of vascular access device: Secondary | ICD-10-CM | POA: Diagnosis not present

## 2023-06-13 DIAGNOSIS — Z452 Encounter for adjustment and management of vascular access device: Secondary | ICD-10-CM | POA: Diagnosis not present

## 2023-06-13 DIAGNOSIS — J984 Other disorders of lung: Secondary | ICD-10-CM | POA: Diagnosis not present

## 2023-06-14 ENCOUNTER — Ambulatory Visit (HOSPITAL_COMMUNITY): Payer: Self-pay

## 2023-06-14 NOTE — Lactation Note (Signed)
This note was copied from a baby's chart.  NICU Lactation Consultation Note  Patient Name: Boy Elsye Morosky ZOXWR'U Date: 06/14/2023 Age:32 years  Reason for consult: Follow-up assessment; NICU baby; Primapara; 1st time breastfeeding; Preterm <34wks; Infant < 6lbs; Other (Comment) (Polysubstance abuse during pregnancy)  SUBJECTIVE  LC in to visit with P1 Mom of baby "[name]".   Mom had inquired about breast pads. Mom is currently using 2 hand pumps at home, expressing 20 ml now.   LC provided Mom with a hand's free pumping top in (small) and assisted her to pump both breasts at the same time.  Mom was thrilled she didn't have to hold the pumps.  Mom was offered a [facility] loaner as her appt isn't for another week.  Mom states that maybe tomorrow she would get it.  LC encouraged Mom to ask for [facility] tomorrow to provide her with this.  Currently Mom is pumping and dumping until UDS is negative.  OBJECTIVE Infant data: No data recorded Infant feeding assessment No data recorded  Maternal data: G1P1 Vaginal, Spontaneous Pumping frequency: 4 times per 24 hrs Pumped volume: 20 mL Flange Size: 21  WIC Program: Yes WIC Referral Sent?: Yes What county?: [place] (appt on 8/26, offered a WIC loaner, may want one tomorrow) Pump: Manual  ASSESSMENT Infant: Feeding Status: Scheduled 9-12-3-6  Maternal: Milk volume: Low  INTERVENTIONS/PLAN Interventions: Interventions: Skin to skin; Breast massage; Hand express; Hand pump; DEBP; Education; Support pillows Discharge Education: Engorgement and breast care Tools: Pump; Flanges; Hands-free pumping top Pump Education: Setup, frequency, and cleaning; Milk Storage  Plan: 1- STS with baby when able to 2- Breast massage and hand expression before and after pumping 3- Pump both breasts on maintenance setting for 20-30 mins.  Consult Status: NICU follow-up NICU Follow-up type: Verify absence of engorgement; Verify onset of copious milk; Weekly  NICU follow up   Judee Clara 06/14/2023, 31:11 PM

## 2023-06-16 DIAGNOSIS — Z4682 Encounter for fitting and adjustment of non-vascular catheter: Secondary | ICD-10-CM | POA: Diagnosis not present

## 2023-06-17 ENCOUNTER — Encounter (HOSPITAL_COMMUNITY): Payer: Self-pay | Admitting: Obstetrics and Gynecology

## 2023-06-18 ENCOUNTER — Other Ambulatory Visit: Payer: Self-pay | Admitting: Family Medicine

## 2023-06-18 DIAGNOSIS — O99320 Drug use complicating pregnancy, unspecified trimester: Secondary | ICD-10-CM

## 2023-06-18 NOTE — Progress Notes (Signed)
Please schedule patient for lab visit ASAP to give urine drug screen.  Needs two visits, one for the week of 8/26 and another for the week of 9/2, I have placed the orders.   Thanks

## 2023-06-23 ENCOUNTER — Ambulatory Visit (INDEPENDENT_AMBULATORY_CARE_PROVIDER_SITE_OTHER): Payer: 59 | Admitting: Clinical

## 2023-06-23 ENCOUNTER — Other Ambulatory Visit: Payer: 59

## 2023-06-23 ENCOUNTER — Encounter: Payer: Self-pay | Admitting: Clinical

## 2023-06-23 ENCOUNTER — Other Ambulatory Visit: Payer: Self-pay | Admitting: Family Medicine

## 2023-06-23 DIAGNOSIS — O99320 Drug use complicating pregnancy, unspecified trimester: Secondary | ICD-10-CM

## 2023-06-23 DIAGNOSIS — Z7689 Persons encountering health services in other specified circumstances: Secondary | ICD-10-CM | POA: Diagnosis not present

## 2023-06-23 DIAGNOSIS — F4323 Adjustment disorder with mixed anxiety and depressed mood: Secondary | ICD-10-CM | POA: Diagnosis not present

## 2023-06-23 DIAGNOSIS — F17200 Nicotine dependence, unspecified, uncomplicated: Secondary | ICD-10-CM

## 2023-06-23 DIAGNOSIS — Z658 Other specified problems related to psychosocial circumstances: Secondary | ICD-10-CM

## 2023-06-23 DIAGNOSIS — F1490 Cocaine use, unspecified, uncomplicated: Secondary | ICD-10-CM

## 2023-06-23 NOTE — Patient Instructions (Addendum)
[facility] [address] [place], [place] [address] [phone] (main office) [phone] [facility] office)  New Parent Support Groups www.postpartum.net www.conehealthybaby.com  "Once Upon a Child"  [place] Copy  (Childcare options, Early childcare development, etc.) DietDisorder.cz  [employer] Child Care Facility Search Engine  https://ncchildcare.http://cook.com/  Transportation Resources [place] [employer] ([place]) [address] [place] [place], [place], [place] [address] https://www.[place].gov/departments/transportation/gdot-divisions/[place]-transit-agency-public-transportation-division     Fixed-route bus services, including regional fare cards for [place], [place], [place], and [place] buses.  Reduced fare bus ID's available for Medicaid, Medicare, and "orange card" recipients.  [employer] offers curb-to-curb and door-to-door bus services for people with disabilities who are unable to use a fixed-bus route; also offers a shared-ride program.   Helpful tips:  -Routes available online and physical maps available at the main bus hub lobby (each for a specific route) -Smartphone directions often include bus routes (see the "bus" icon, next to the "car" and "walk" icons) -Routes differ on weekends, evenings and holidays, so plan ahead!  -If you have Medicaid, Medicare, or orange card, plan to obtain a reduced-fare ID to save 50% on rides. Check days and times to obtain an ID, and bring all necessary documents.    /Emotional Wellbeing Apps and Websites Here are a few free apps meant to help you to help yourself.  To find, try searching on the internet to see if the app is offered on Apple/Android devices. If your first choice doesn't come up on your device, the good news is that there are many choices! Play around with different apps to see which ones are  helpful to you.    Calm This is an app meant to help increase calm feelings. Includes info, strategies, and tools for tracking your feelings.      Calm Harm  This app is meant to help with self-harm. Provides many 5-minute or 15-min coping strategies for doing instead of hurting yourself.       Healthy Minds Health Minds is a problem-solving tool to help deal with emotions and cope with stress you encounter wherever you are.      MindShift This app can help people cope with anxiety. Rather than trying to avoid anxiety, you can make an important shift and face it.      MY3  MY3 features a support system, safety plan and resources with the goal of offering a tool to use in a time of need.       My Life My Voice  This mood journal offers a simple solution for tracking your thoughts, feelings and moods. Animated emoticons can help identify your mood.       Relax Melodies Designed to help with sleep, on this app you can mix sounds and meditations for relaxation.      Smiling Mind Smiling Mind is meditation made easy: it's a simple tool that helps put a smile on your mind.        Stop, Breathe & Think  A friendly, simple guide for people through meditations for mindfulness and compassion.  Stop, Breathe and Think Kids Enter your current feelings and choose a "mission" to help you cope. Offers videos for certain moods instead of just sound recordings.       Team Orange The goal of this tool is to help teens change how they think, act, and react. This app helps you focus on your own good feelings and experiences.  The [employer] Box The [employer] Box (VHB) contains simple tools to help patients with coping, relaxation, distraction, and positive thinking.

## 2023-06-25 ENCOUNTER — Encounter (HOSPITAL_COMMUNITY): Payer: Self-pay | Admitting: *Deleted

## 2023-06-25 ENCOUNTER — Ambulatory Visit (HOSPITAL_COMMUNITY)
Admission: EM | Admit: 2023-06-25 | Discharge: 2023-06-25 | Disposition: A | Discharge status: Home / Self Care | Payer: 59 | Attending: Internal Medicine | Admitting: Internal Medicine

## 2023-06-25 DIAGNOSIS — R109 Unspecified abdominal pain: Secondary | ICD-10-CM

## 2023-06-25 DIAGNOSIS — R300 Dysuria: Secondary | ICD-10-CM | POA: Diagnosis not present

## 2023-06-25 DIAGNOSIS — K9429 Other complications of gastrostomy: Secondary | ICD-10-CM | POA: Diagnosis not present

## 2023-06-25 LAB — POCT URINALYSIS DIP (MANUAL ENTRY)
Bilirubin, UA: NEGATIVE
Glucose, UA: NEGATIVE mg/dL
Ketones, POC UA: NEGATIVE mg/dL
Nitrite, UA: NEGATIVE
Protein Ur, POC: 100 mg/dL — AB
Spec Grav, UA: 1.02 (ref 1.010–1.025)
Urobilinogen, UA: 0.2 E.U./dL
pH, UA: 7 (ref 5.0–8.0)

## 2023-06-25 MED ORDER — CEFTRIAXONE SODIUM 1 G IJ SOLR
1.0000 g | Freq: Once | INTRAMUSCULAR | Status: AC
  Administered 2023-06-25: 1 g via INTRAMUSCULAR

## 2023-06-25 MED ORDER — STERILE WATER FOR INJECTION IJ SOLN
INTRAMUSCULAR | Status: AC
  Filled 2023-06-25: qty 10

## 2023-06-25 MED ORDER — ONDANSETRON 4 MG PO TBDP: 4.0000 mg | ORAL_TABLET | Freq: Three times a day (TID) | ORAL | 0 refills | Status: DC | PRN

## 2023-06-25 MED ORDER — SULFAMETHOXAZOLE-TRIMETHOPRIM 800-160 MG PO TABS
1.0000 | ORAL_TABLET | Freq: Two times a day (BID) | ORAL | 0 refills | Status: AC
Start: 2023-06-25 — End: 2023-07-05

## 2023-06-25 MED ORDER — CEFTRIAXONE SODIUM 1 G IJ SOLR
INTRAMUSCULAR | Status: AC
  Filled 2023-06-25: qty 10

## 2023-06-25 MED ORDER — CEFTRIAXONE SODIUM 500 MG IJ SOLR
INTRAMUSCULAR | Status: AC
  Filled 2023-06-25: qty 500

## 2023-06-25 MED ORDER — ACETAMINOPHEN 325 MG PO TABS
ORAL_TABLET | ORAL | Status: AC
  Filled 2023-06-25: qty 3

## 2023-06-25 MED ORDER — ACETAMINOPHEN 325 MG PO TABS
975.0000 mg | ORAL_TABLET | Freq: Once | ORAL | Status: AC
  Administered 2023-06-25: 975 mg via ORAL

## 2023-06-25 NOTE — ED Provider Notes (Signed)
[facility]    CSN: [id number] Arrival date & time: 06/25/23  1914      History   Chief Complaint Chief Complaint  Patient presents with   Flank Pain    HPI Sylvia Mccann is a 32 y.o. female.   Patient presents today with 3-day history of pain with urination, increased urinary urgency and frequency with voiding smaller amounts, lower abdominal pain, and right side pain with tactile fever and chills.  Patient denies foul urinary odor, hematuria, suprapubic pain or pressure, nausea/vomiting, and vaginal discharge.  Reports history of pyelonephritis and she felt similarly when she had the symptoms.  Reports that she recently gave birth unexpectedly approximately 20 days ago and is currently pumping breastmilk and discarding because she was using marijuana and cocaine while pregnant.  Reports she has not used any marijuana or cocaine since giving birth.  Denies sexual activity since giving birth and no vaginal discharge.  No pelvic pain.  Took ibuprofen earlier today for tactile fever with improvement.    Past Medical History:  Diagnosis Date   Asthma    Migraine     Patient Active Problem List   Diagnosis Date Noted   Preterm delivery 06/05/2023   Cocaine use complicating pregnancy 06/05/2023   Alcohol use affecting pregnancy 06/05/2023   Major depressive disorder, single episode, severe without psychotic features (HCC) 10/22/2017   Major depressive disorder, single episode, severe without psychosis (HCC) 10/22/2017    Past Surgical History:  Procedure Laterality Date   NO PAST SURGERIES      OB History     Gravida  1   Para  1   Term      Preterm      AB      Living  1      SAB      IAB      Ectopic      Multiple      Live Births  1            Home Medications    Prior to Admission medications   Medication Sig Start Date End Date Taking? Authorizing Provider  ondansetron (ZOFRAN-ODT) 4 MG disintegrating tablet Take 1 tablet (4 mg  total) by mouth every 8 (eight) hours as needed for nausea or vomiting. 06/25/23  Yes Cathlean Marseilles A, NP  sulfamethoxazole-trimethoprim (BACTRIM DS) 800-160 MG tablet Take 1 tablet by mouth 2 (two) times daily for 10 days. 06/25/23 07/05/23 Yes Valentino Nose, NP  albuterol (PROVENTIL HFA;VENTOLIN HFA) 108 (90 Base) MCG/ACT inhaler Inhale 1-2 puffs into the lungs every 6 (six) hours as needed for wheezing or shortness of breath.    [provider]  ibuprofen (ADVIL) 600 MG tablet Take 1 tablet (600 mg total) by mouth every 6 (six) hours. 06/07/23   Warden Fillers, MD    Family History Family History  Problem Relation Age of Onset   Schizophrenia Mother    Bipolar disorder Mother    Schizophrenia Father     Social History Social History   Tobacco Use   Smoking status: Every Day    Current packs/day: 0.50    Types: Cigarettes   Smokeless tobacco: Never  Substance Use Topics   Alcohol use: Yes    Alcohol/week: 4.0 standard drinks of alcohol    Types: 2 Cans of beer, 2 Shots of liquor per week   Drug use: Yes    Types: Cocaine     Allergies   Diclofenac potassium(migraine)  Review of Systems Review of Systems Per HPI  Physical Exam Triage Vital Signs ED Triage Vitals  Encounter Vitals Group     BP 06/25/23 0855 126/81     Systolic BP Percentile --      Diastolic BP Percentile --      Pulse Rate 06/25/23 0855 82     Resp 06/25/23 0855 20     Temp 06/25/23 0855 98.9 F (37.2 C)     Temp src --      SpO2 06/25/23 0855 95 %     Weight --      Height --      Head Circumference --      Peak Flow --      Pain Score 06/25/23 0852 10     Pain Loc --      Pain Education --      Exclude from Growth Chart --    No data found.  Updated Vital Signs BP 126/81   Pulse 82   Temp 98.9 F (37.2 C)   Resp 20   SpO2 95%   Visual Acuity Right Eye Distance:   Left Eye Distance:   Bilateral Distance:    Right Eye Near:   Left Eye Near:    Bilateral  Near:     Physical Exam Vitals and nursing note reviewed.  Constitutional:      General: She is not in acute distress.    Appearance: She is not toxic-appearing.     Comments: Uncomfortable appearing in triage  HENT:     Mouth/Throat:     Mouth: Mucous membranes are moist.     Pharynx: Oropharynx is clear. No oropharyngeal exudate or posterior oropharyngeal erythema.  Cardiovascular:     Rate and Rhythm: Normal rate and regular rhythm.  Pulmonary:     Effort: Pulmonary effort is normal. No respiratory distress.     Breath sounds: Normal breath sounds. No wheezing, rhonchi or rales.  Abdominal:     General: Abdomen is flat. Bowel sounds are normal. There is no distension.     Palpations: Abdomen is soft. There is no mass.     Tenderness: There is abdominal tenderness. There is right CVA tenderness. There is no left CVA tenderness, guarding or rebound. Negative signs include Murphy's sign, Rovsing's sign and McBurney's sign.    Skin:    General: Skin is warm and dry.     Coloration: Skin is not jaundiced or pale.     Findings: No erythema.  Neurological:     Mental Status: She is alert and oriented to person, place, and time.     Motor: No weakness.     Gait: Gait normal.  Psychiatric:        Behavior: Behavior is cooperative.      UC Treatments / Results  Labs (all labs ordered are listed, but only abnormal results are displayed) Labs Reviewed  POCT URINALYSIS DIP (MANUAL ENTRY) - Abnormal; Notable for the following components:      Result Value   Blood, UA large (*)    Protein Ur, POC =100 (*)    Leukocytes, UA Small (1+) (*)    All other components within normal limits  URINE CULTURE    EKG   Radiology No results found.  Procedures Procedures (including critical care time)  Medications Ordered in UC Medications  cefTRIAXone (ROCEPHIN) injection 1 g (1 g Intramuscular Given 06/25/23 1026)    Initial Impression / Assessment and Plan / UC Course  I have  reviewed the triage vital signs and the nursing notes.  Pertinent labs & imaging results that were available during my care of the patient were reviewed by me and considered in my medical decision making (see chart for details).   Patient is well-appearing, normotensive, afebrile, not tachycardic, not tachypneic, oxygenating well on room air.    1. Dysuria 2. Right flank pain Overall, vitals and exam today reassuring Patient does have lower abdominal discomfort with palpation, however no guarding Also has right-sided CVA tenderness Will treat for pyelonephritis with ceftriaxone 1 g IM, start oral Bactrim Recommended continuing to pump and discard breastmilk until oral antibiotics are complete Other supportive care discussed including increased hydration with water, Zofran as needed for nausea/vomiting Strict ER precautions discussed with patient Work excuse given  The patient was given the opportunity to ask questions.  All questions answered to their satisfaction.  The patient is in agreement to this plan.    Final Clinical Impressions(s) / UC Diagnoses   Final diagnoses:  Dysuria  Right flank pain     Discharge Instructions      We are treating you today for a kidney infection with Rocephin.  Also start taking the Bactrim twice daily for 10 days.  Make sure you continue pumping your breastmilk, but dump it out until you have completed the antibiotic.  Start Zofran as needed for nausea/vomiting.  Make sure you are drinking plenty of water.  We will contact you if the urine culture shows that we need to change the antibiotic.  If you develop fever, nausea/vomiting and are unable to keep fluids down, or severe pain that is uncontrolled with over-the-counter medications, please seek care in the emergency room.     ED Prescriptions     Medication Sig Dispense Auth. Provider   sulfamethoxazole-trimethoprim (BACTRIM DS) 800-160 MG tablet Take 1 tablet by mouth 2 (two) times daily  for 10 days. 20 tablet Cathlean Marseilles A, NP   ondansetron (ZOFRAN-ODT) 4 MG disintegrating tablet Take 1 tablet (4 mg total) by mouth every 8 (eight) hours as needed for nausea or vomiting. 20 tablet Valentino Nose, NP      PDMP not reviewed this encounter.   Valentino Nose, NP 06/25/23 1029

## 2023-06-25 NOTE — ED Triage Notes (Signed)
Pt reports Rt flank pain for 2 days and cramping on Rt flank. Pt reports she has had Pyelonephritis once before .

## 2023-06-25 NOTE — Discharge Instructions (Addendum)
We are treating you today for a kidney infection with Rocephin.  Also start taking the Bactrim twice daily for 10 days.  Make sure you continue pumping your breastmilk, but dump it out until you have completed the antibiotic.  Start Zofran as needed for nausea/vomiting.  Make sure you are drinking plenty of water.  We will contact you if the urine culture shows that we need to change the antibiotic.  If you develop fever, nausea/vomiting and are unable to keep fluids down, or severe pain that is uncontrolled with over-the-counter medications, please seek care in the emergency room.

## 2023-06-26 DIAGNOSIS — Z4682 Encounter for fitting and adjustment of non-vascular catheter: Secondary | ICD-10-CM | POA: Diagnosis not present

## 2023-06-27 DIAGNOSIS — Z419 Encounter for procedure for purposes other than remedying health state, unspecified: Secondary | ICD-10-CM | POA: Diagnosis not present

## 2023-06-27 LAB — URINE CULTURE: Culture: 50000 — AB

## 2023-06-27 LAB — TOXASSURE FLEX 15, UR
6-ACETYLMORPHINE IA: NEGATIVE ng/mL
7-aminoclonazepam: NOT DETECTED ng/mg{creat}
AMPHETAMINES IA: NEGATIVE ng/mL
Alpha-hydroxyalprazolam: NOT DETECTED ng/mg{creat}
Alpha-hydroxymidazolam: NOT DETECTED ng/mg creat
Alpha-hydroxytriazolam: NOT DETECTED ng/mg{creat}
Alprazolam: NOT DETECTED ng/mg{creat}
BARBITURATES IA: NEGATIVE ng/mL
BUPRENORPHINE: NEGATIVE
Benzodiazepines: NEGATIVE
Buprenorphine: NOT DETECTED ng/mg creat
CANNABINOIDS IA: NEGATIVE ng/mL
COCAINE METABOLITE IA: NEGATIVE ng/mL
Clonazepam: NOT DETECTED ng/mg{creat}
Creatinine: 75 mg/dL
Desalkylflurazepam: NOT DETECTED ng/mg{creat}
Desmethyldiazepam: NOT DETECTED ng/mg{creat}
Desmethylflunitrazepam: NOT DETECTED ng/mg creat
Diazepam: NOT DETECTED ng/mg{creat}
ETHYL ALCOHOL Enzymatic: NEGATIVE g/dL
FENTANYL: NEGATIVE
Fentanyl: NOT DETECTED ng/mg{creat}
Flunitrazepam: NOT DETECTED ng/mg{creat}
Lorazepam: NOT DETECTED ng/mg{creat}
METHADONE IA: NEGATIVE ng/mL
METHADONE MTB IA: NEGATIVE ng/mL
Midazolam: NOT DETECTED ng/mg{creat}
Norbuprenorphine: NOT DETECTED ng/mg{creat}
Norfentanyl: NOT DETECTED ng/mg{creat}
OPIATE CLASS IA: NEGATIVE ng/mL
OXYCODONE CLASS IA: NEGATIVE ng/mL
Oxazepam: NOT DETECTED ng/mg{creat}
PHENCYCLIDINE IA: NEGATIVE ng/mL
TAPENTADOL, IA: NEGATIVE ng/mL
TRAMADOL IA: NEGATIVE ng/mL
Temazepam: NOT DETECTED ng/mg{creat}

## 2023-06-27 LAB — SPECIMEN STATUS REPORT

## 2023-06-29 ENCOUNTER — Telehealth: Payer: Self-pay | Admitting: General Practice

## 2023-06-29 NOTE — Telephone Encounter (Signed)
Copied from CRM [id number]. Topic: Appointment Scheduling - New Patient >> Jun 29, 2023  9:46 AM Blima Rich wrote: Pt has been scheduled for new patient appt on 09/13.

## 2023-06-30 NOTE — BH Specialist Note (Signed)
Integrated Behavioral Health Follow Up In-Person Visit  MRN: [id number] Name: Navleen Gaymon  Number of Integrated Behavioral Health Clinician visits: 2- Second Visit  Session Start time: 1416   Session End time: 1448  Total time in minutes: 32   Types of Service: Individual psychotherapy  Interpretor:No. Interpretor Name and Language: n/a  Subjective: Tahirih Delbuono is a 32 y.o. female accompanied by  n/a Patient was referred by Merian Capron, MD for history of depression. Patient reports the following symptoms/concerns: Depressive and anxious symptoms while remaining hopeful with baby gaining weight in NICU; Pt is sleeping and eating well, maintaining work-life balance, adjusting to new motherhood with good support; started taking nicotine patch to aid in goal of stop smoking.  Duration of problem: Postpartum; Severity of problem: moderate  Objective: Mood: Anxious and Affect: Appropriate Risk of harm to self or others: No plan to harm self or others  Life Context: Family and Social: Pt lives with supportive ; visits baby in NICU daily School/Work: working 3 hours/day as caretaker Self-Care: Motivated to stop smoking; work on overall physical and emotional wellness Life Changes: Gave birth same day she found out she was pregnant  Patient and/or Family's Strengths/Protective Factors: Social connections, Sense of purpose, and Physical Health (exercise, healthy diet, medication compliance, etc.)  Goals Addressed: Patient will:  Reduce symptoms of: anxiety, depression, and stress   Increase knowledge and/or ability of: stress reduction   Demonstrate ability to: Increase healthy adjustment to current life circumstances and Increase motivation to adhere to plan of care  Progress towards Goals: Ongoing  Interventions: Interventions utilized:  Supportive Reflection Standardized Assessments completed: Not Needed  Patient and/or Family Response: Patient agrees with  treatment plan.   Patient Centered Plan: Patient is on the following Treatment Plan(s): IBH Assessment: Patient currently experiencing Adjustment disorder with mixed anxious and depressed mood; Psychosocial stress.   Patient may benefit from continued therapeutic intervention.  Plan: Follow up with behavioral health clinician on : Two weeks Behavioral recommendations:  - Continue prioritizing healthy self-care daily; working with agencies to obtain remaining baby items to prepare for baby's discharge home in the future; working and saving money to have time off when baby is home -Continue using nicotine patches to maintain goal of stop smoking  -Continue plan to re-apply for EBT , obtain [place]; apply for social security disability benefits for baby  Referral(s): [facility] [employer] [facility] (In Clinic)  Valetta Close [place], [place]     07/09/2023    9:31 AM 06/23/2023    4:30 PM 04/18/2020    1:46 PM  Depression screen PHQ 2/9  Decreased Interest 0 0 3  Down, Depressed, Hopeless 1 1 2   PHQ - 2 Score 1 1 5   Altered sleeping  1 2  Tired, decreased energy  2 3  Change in appetite  2 3  Feeling bad or failure about yourself   2 2  Trouble concentrating  2 3  Moving slowly or fidgety/restless  3 0  Suicidal thoughts  0 0  PHQ-9 Score  13 18      07/09/2023    9:32 AM 06/23/2023    4:31 PM 04/18/2020    1:47 PM  GAD 7 : Generalized Anxiety Score  Nervous, Anxious, on Edge 1 1 3   Control/stop worrying 1 2 3   Worry too much - different things 1 2 3   Trouble relaxing 1 2 0  Restless 1 2 0  Easily annoyed or irritable 1 3 3  Afraid - awful might happen 0 1 0  Total GAD 7 Score 6 13 12   Anxiety Difficulty Very difficult

## 2023-07-01 ENCOUNTER — Ambulatory Visit (HOSPITAL_COMMUNITY): Payer: Self-pay

## 2023-07-01 NOTE — Lactation Note (Signed)
This note was copied from a baby's chart. Lactation Consultation Note  Patient Name: Boy Iolene Pomar WGNFA'O Date: 07/01/2023 Age:31 wk.o.   Attempted to visit with Ms. Waiters today but she didn't come to the hospital; she usually comes in the evening. Spoke to her NICU RN Dorathy Daft and she voiced she has transportation barriers. Family is aware of LC services and will call PRN.   Asaiah Scarber Venetia Constable 07/01/2023, 6:53 PM

## 2023-07-02 ENCOUNTER — Telehealth (HOSPITAL_COMMUNITY): Payer: Self-pay | Admitting: *Deleted

## 2023-07-02 ENCOUNTER — Ambulatory Visit (HOSPITAL_COMMUNITY): Payer: Self-pay

## 2023-07-02 NOTE — Telephone Encounter (Signed)
07/02/2023  Name: Lamyah Durling MRN: [id number] DOB: 05/21/1991  Reason for Call:  Transition of Care Hospital Discharge Call  Contact Status: Patient Contact Status: Message  Woman who answered the phone said that patient was visiting her baby in the NICU.  Left message for patient to call if patient has any questions or concerns regarding her health.  Language assistant needed:          Follow-Up Questions:    Inocente Salles Postnatal Depression Scale:  In the Past 7 Days:    PHQ2-9 Depression Scale:     Discharge Follow-up:    Post-discharge interventions: NA  Salena Saner, RN 07/02/2023 13:47

## 2023-07-02 NOTE — Lactation Note (Signed)
This note was copied from a baby's chart.  NICU Lactation Consultation Note  Patient Name: Sylvia Mccann UUVOZ'D Date: 07/02/2023 Age:31 wk.o.  Reason for consult: Weekly NICU follow-up; Primapara; 1st time breastfeeding; NICU baby; Preterm <34wks; Infant < 6lbs; Other (Comment); RN request (No PNC, prenatal and cocaine and alcohol use)  SUBJECTIVE Visited with family of 63 44/86 weeks old AGA NICU female; Sylvia Mccann is a P1 and reports she's still pumping at 3 weeks post-partum but not consistently, her nipples are sensitive. She also felt discouraged because we couldn't use her milk to feed baby to due Hx of polysubstance use. The providers have cleared out for her to start providing her EBM but she's now struggling with supply. She started turning in her milk today and getting about 15 ml out of the 25 ml that baby "Sylvia Mccann" needs for his gavage feedings, praised her for her efforts. Noticed pumping hasn't been consistent, explained the importance of consistent pumping to increase and protect her supply. Reviewed pumping schedule and strategies to increase supply, advised to D/C numbing cream, alcohol pads and Vaseline on nipple area. Provided breast pads per her request.  OBJECTIVE Infant data: Mother's Current Feeding Choice: Breast Milk and Donor Milk  O2 Device: Ventilator FiO2 (%): 28 %  Maternal data: G1P1 Vaginal, Spontaneous Pumping frequency: 4 times/24 hours Pumped volume: 15 mL Flange Size: 21  WIC Program: Yes WIC Referral Sent?: Yes What county?: [place] (appt on 8/26, offered a WIC loaner, may want one tomorrow) Pump: WIC Pump  ASSESSMENT Infant: Feeding Status: Scheduled 8-11-2-5 Feeding method: Tube/Gavage (Bolus)  Maternal: Milk volume: Low  INTERVENTIONS/PLAN Interventions: Interventions: Breast feeding basics reviewed; Coconut oil; DEBP; Education Tools: Pump; Flanges Pump Education: Setup, frequency, and cleaning; Milk Storage  Plan: Encouraged  pumping every 3 hours, ideally 8 pumping sessions/24 hours She'll start power pumping in the AM She'll increase her fluid intake and aim for a healthy diet   Female visitor present. All questions and concerns answered, family to contact [facility] services PRN.  Consult Status: NICU follow-up NICU Follow-up type: Weekly NICU follow up   Sylvia Mccann S Sylvia Mccann 07/02/2023, 1:39 PM

## 2023-07-09 ENCOUNTER — Ambulatory Visit (INDEPENDENT_AMBULATORY_CARE_PROVIDER_SITE_OTHER): Payer: 59 | Admitting: Family Medicine

## 2023-07-09 ENCOUNTER — Encounter: Payer: Self-pay | Admitting: Family Medicine

## 2023-07-09 VITALS — BP 111/73 | HR 73 | Temp 98.5°F | Resp 16 | Ht 64.0 in | Wt 199.6 lb

## 2023-07-09 DIAGNOSIS — Z23 Encounter for immunization: Secondary | ICD-10-CM | POA: Diagnosis not present

## 2023-07-09 DIAGNOSIS — Z7689 Persons encountering health services in other specified circumstances: Secondary | ICD-10-CM | POA: Diagnosis not present

## 2023-07-09 DIAGNOSIS — F53 Postpartum depression: Secondary | ICD-10-CM

## 2023-07-09 DIAGNOSIS — J4520 Mild intermittent asthma, uncomplicated: Secondary | ICD-10-CM | POA: Diagnosis not present

## 2023-07-09 DIAGNOSIS — F17200 Nicotine dependence, unspecified, uncomplicated: Secondary | ICD-10-CM

## 2023-07-09 MED ORDER — ALBUTEROL SULFATE HFA 108 (90 BASE) MCG/ACT IN AERS: 1.0000 | INHALATION_SPRAY | Freq: Four times a day (QID) | RESPIRATORY_TRACT | 1 refills | Status: DC | PRN

## 2023-07-09 MED ORDER — PRENATAL VITAMIN 27-0.8 MG PO TABS: 1.0000 | ORAL_TABLET | Freq: Every day | ORAL | 1 refills | Status: DC

## 2023-07-09 MED ORDER — NICOTINE POLACRILEX 4 MG MT GUM: 4.0000 mg | CHEWING_GUM | OROMUCOSAL | 1 refills | Status: DC | PRN

## 2023-07-12 ENCOUNTER — Encounter: Payer: Self-pay | Admitting: Family Medicine

## 2023-07-12 NOTE — Progress Notes (Signed)
New Patient Office Visit  Subjective    Patient ID: Sylvia Mccann, female    DOB: Jun 28, 1991  Age: 32 y.o. MRN: [id number]  CC:  Chief Complaint  Patient presents with   Establish Care    HPI Sylvia Mccann presents to establish care and for review of chronic med issues. Patient denies acute complaints or concerns.    Outpatient Encounter Medications as of 07/09/2023  Medication Sig   ibuprofen (ADVIL) 600 MG tablet Take 1 tablet (600 mg total) by mouth every 6 (six) hours.   nicotine polacrilex (NICORETTE) 4 MG gum Take 1 each (4 mg total) by mouth as needed for smoking cessation.   ondansetron (ZOFRAN-ODT) 4 MG disintegrating tablet Take 1 tablet (4 mg total) by mouth every 8 (eight) hours as needed for nausea or vomiting.   Prenatal Vit-Fe Fumarate-FA (PRENATAL VITAMIN) 27-0.8 MG TABS Take 1 tablet by mouth daily.   [DISCONTINUED] albuterol (PROVENTIL HFA;VENTOLIN HFA) 108 (90 Base) MCG/ACT inhaler Inhale 1-2 puffs into the lungs every 6 (six) hours as needed for wheezing or shortness of breath.   albuterol (VENTOLIN HFA) 108 (90 Base) MCG/ACT inhaler Inhale 1-2 puffs into the lungs every 6 (six) hours as needed for wheezing or shortness of breath.   No facility-administered encounter medications on file as of 07/09/2023.    Past Medical History:  Diagnosis Date   Asthma    Migraine     Past Surgical History:  Procedure Laterality Date   NO PAST SURGERIES      Family History  Problem Relation Age of Onset   Schizophrenia Mother    Bipolar disorder Mother    Schizophrenia Father     Social History   Socioeconomic History   Marital status: Single    Spouse name: Not on file   Number of children: Not on file   Years of education: Not on file   Highest education level: 12th grade  Occupational History   Not on file  Tobacco Use   Smoking status: Every Day    Current packs/day: 0.50    Types: Cigarettes   Smokeless tobacco: Never  Substance and Sexual Activity    Alcohol use: Yes    Alcohol/week: 4.0 standard drinks of alcohol    Types: 2 Cans of beer, 2 Shots of liquor per week   Drug use: Yes    Types: Cocaine   Sexual activity: Yes    Birth control/protection: None  Other Topics Concern   Not on file  Social History Narrative   Not on file   Social Determinants of Health   Financial Resource Strain: Medium Risk (07/05/2023)   Overall Financial Resource Strain (CARDIA)    Difficulty of Paying Living Expenses: Somewhat hard  Food Insecurity: Food Insecurity Present (07/05/2023)   Hunger Vital Sign    Worried About Running Out of Food in the Last Year: Sometimes true    Ran Out of Food in the Last Year: Sometimes true  Transportation Needs: Unmet Transportation Needs (07/05/2023)   PRAPARE - Transportation    Lack of Transportation (Medical): Yes    Lack of Transportation (Non-Medical): Yes  Physical Activity: Inactive (07/05/2023)   Exercise Vital Sign    Days of Exercise per Week: 1 day    Minutes of Exercise per Session: 0 min  Stress: Stress Concern Present (07/09/2023)   [employer] of Occupational Health - Occupational Stress Questionnaire    Feeling of Stress : To some extent  Social Connections: Socially Isolated (07/05/2023)  Social Advertising account executive [NHANES]    Frequency of Communication with Friends and Family: More than three times a week    Frequency of Social Gatherings with Friends and Family: Twice a week    Attends Religious Services: Never    Database administrator or Organizations: No    Attends Engineer, structural: Not on file    Marital Status: Never married  Intimate Partner Violence: Not At Risk (06/05/2023)   Humiliation, Afraid, Rape, and Kick questionnaire    Fear of Current or Ex-Partner: No    Emotionally Abused: No    Physically Abused: No    Sexually Abused: No    Review of Systems  All other systems reviewed and are negative.       Objective    BP 111/73 (BP Location:  Left Arm, Patient Position: Sitting, Cuff Size: Large)   Pulse 73   Temp 98.5 F (36.9 C) (Oral)   Resp 16   Ht 5\' 4"  (1.626 m)   Wt 199 lb 9.6 oz (90.5 kg)   SpO2 96%   BMI 34.26 kg/m   Physical Exam Vitals and nursing note reviewed.  Constitutional:      General: She is not in acute distress. Cardiovascular:     Rate and Rhythm: Normal rate and regular rhythm.  Pulmonary:     Effort: Pulmonary effort is normal.     Breath sounds: Normal breath sounds.  Abdominal:     Palpations: Abdomen is soft.     Tenderness: There is no abdominal tenderness.  Neurological:     General: No focal deficit present.     Mental Status: She is alert and oriented to person, place, and time.  Psychiatric:        Mood and Affect: Affect normal. Mood is depressed.         Assessment & Plan:   Post partum depression  Mild intermittent asthma without complication  Smoker  Immunization due -     MMR vaccine subcutaneous  Encounter for immunization -     Flu vaccine trivalent PF, 6mos and older(Flulaval,Afluria,Fluarix,Fluzone)  Encounter to establish care  Other orders -     Nicotine Polacrilex; Take 1 each (4 mg total) by mouth as needed for smoking cessation.  Dispense: 100 tablet; Refill: 1 -     Prenatal Vitamin; Take 1 tablet by mouth daily.  Dispense: 90 tablet; Refill: 1 -     Albuterol Sulfate HFA; Inhale 1-2 puffs into the lungs every 6 (six) hours as needed for wheezing or shortness of breath.  Dispense: 18 g; Refill: 1     No follow-ups on file.   [name], MD

## 2023-07-13 ENCOUNTER — Other Ambulatory Visit: Payer: Self-pay | Admitting: Certified Nurse Midwife

## 2023-07-13 ENCOUNTER — Other Ambulatory Visit: Payer: Self-pay

## 2023-07-13 ENCOUNTER — Ambulatory Visit (INDEPENDENT_AMBULATORY_CARE_PROVIDER_SITE_OTHER): Payer: 59 | Admitting: Certified Nurse Midwife

## 2023-07-13 ENCOUNTER — Encounter: Payer: Self-pay | Admitting: Certified Nurse Midwife

## 2023-07-13 DIAGNOSIS — F1910 Other psychoactive substance abuse, uncomplicated: Secondary | ICD-10-CM | POA: Diagnosis not present

## 2023-07-13 DIAGNOSIS — K5900 Constipation, unspecified: Secondary | ICD-10-CM | POA: Diagnosis not present

## 2023-07-13 DIAGNOSIS — Z3009 Encounter for other general counseling and advice on contraception: Secondary | ICD-10-CM | POA: Diagnosis not present

## 2023-07-13 DIAGNOSIS — F329 Major depressive disorder, single episode, unspecified: Secondary | ICD-10-CM

## 2023-07-13 MED ORDER — DOCUSATE SODIUM 100 MG PO CAPS: 100.0000 mg | ORAL_CAPSULE | Freq: Two times a day (BID) | ORAL | 0 refills | Status: AC

## 2023-07-13 NOTE — Progress Notes (Signed)
Post Partum Visit Note  Sylvia Mccann is a 32 y.o. G1P1 female who presents for a postpartum visit. She is 5 weeks postpartum following a normal spontaneous vaginal delivery.  I have fully reviewed the prenatal and intrapartum course. The delivery was at unknown gestational weeks.  Anesthesia: none. Postpartum course has been "depressing because baby is in the NICU". Baby is doing well in the NICU per patient. Baby is feeding by both breast and bottle using Similac. Bleeding dark red. Bowel function is constipated. Pt requests for stool softener.  Bladder function is normal. Patient is sexually active. Contraception method is condoms. Postpartum depression screening: positive. Patient has an appointment for [facility] tomorrow and patient plans to come to appointment.     The pregnancy intention screening data noted above was reviewed. Potential methods of contraception were discussed. The patient elected to proceed with No data recorded.   Edinburgh Postnatal Depression Scale - 07/13/23 1715       Edinburgh Postnatal Depression Scale:  In the Past 7 Days   I have been able to laugh and see the funny side of things. 1    I have looked forward with enjoyment to things. 1    I have blamed myself unnecessarily when things went wrong. 3    I have been anxious or worried for no good reason. 2    I have felt scared or panicky for no good reason. 0    Things have been getting on top of me. 3    I have been so unhappy that I have had difficulty sleeping. 1    I have felt sad or miserable. 2    I have been so unhappy that I have been crying. 2    The thought of harming myself has occurred to me. 0    Edinburgh Postnatal Depression Scale Total 15             Health Maintenance Due  Topic Date Due   Cervical Cancer Screening (HPV/Pap Cotest)  04/19/2023    The following portions of the patient's history were reviewed and updated as appropriate: allergies, current medications, past family  history, past medical history, past social history, past surgical history, and problem list.  Review of Systems Pertinent items noted in HPI and remainder of comprehensive ROS otherwise negative.  Objective:  BP 117/66   Pulse 83   Wt 204 lb (92.5 kg)   Breastfeeding Yes Comment: and formula  BMI 35.02 kg/m    General:  alert, cooperative, and appears stated age   Breasts:  not indicated  Lungs: clear to auscultation bilaterally  Heart:  regular rate and rhythm, S1, S2 normal, no murmur, click, rub or gallop  Abdomen: soft, non-tender; bowel sounds normal; no masses,  no organomegaly   Wound N/A   GU exam:  not indicated       Assessment:    1. Postpartum care and examination - Patient doing well. Traveling back and froth from NICU.  - Reviewed postpartum bleeding expectations, with increased activity and placental eschar.   2. Polysubstance abuse (HCC) - Toxassure unable to be collected today.  - Patient will return tomorrow for collection during IBH visit.  - Patient agreeable with plan of care.   3. Major depressive disorder with current active episode, unspecified depression episode severity, unspecified whether recurrent - Patient has IBH appt scheduled for tomorrow. Changed from Virtual to In-person. Encouraged to keep appt.   4. Birth control counseling - Patient desires  condoms.  - Condoms given today prior to discharge.    Normal postpartum exam.   Plan:   Essential components of care per ACOG recommendations:  1.  Mood and well being: Patient with positive depression screening today. Reviewed local resources for support.  - Patient tobacco use? Yes. Patient desires to quit? No.   - hx of drug use? Yes. Discussed support systems and outpatient/inpatient treatment options. Referral to [facility] accepted.    2. Infant care and feeding:  -Patient currently breastmilk feeding? Yes. Reviewed importance of draining breast regularly to support lactation.  -Social  determinants of health (SDOH) reviewed in EPIC.   3. Sexuality, contraception and birth spacing - Patient does not want a pregnancy in the next year.  Desired family size is 1 children.  - Reviewed reproductive life planning. Reviewed contraceptive methods based on pt preferences and effectiveness.  Patient desired Female Condom today.   - Discussed birth spacing of 18 months  4. Sleep and fatigue -Encouraged family/partner/community support of 4 hrs of uninterrupted sleep to help with mood and fatigue  5. Physical Recovery  - Discussed patients delivery and complications. She describes her labor as mixed. - Patient had a Vaginal problems after delivery including unknown pregnancy and preterm Labor and delivery.  . Patient had a  no  laceration. Perineal healing reviewed. Patient expressed understanding - Patient has urinary incontinence? No. - Patient is safe to resume physical and sexual activity  6.  Health Maintenance - HM due items addressed Yes - Last pap smear  Diagnosis  Date Value Ref Range Status  04/18/2020   Final   - Negative for intraepithelial lesion or malignancy (NILM)   Pap smear not done at today's visit.  -Breast Cancer screening indicated? No.   7. Chronic Disease/Pregnancy Condition follow up: None  - PCP follow up  Claudette Head, CNM Center for [facility], [facility]

## 2023-07-13 NOTE — Progress Notes (Deleted)
    Post Partum Visit Note  Sylvia Mccann is a 32 y.o. G1P1 female who presents for a postpartum visit. She is 5 weeks postpartum following a normal spontaneous vaginal delivery.  I have fully reviewed the prenatal and intrapartum course. The delivery was at unknown gestational weeks.  Anesthesia: {anesthesia types:812}. Postpartum course has been ***. Baby is doing well***. Baby is feeding by {breastmilk/bottle:69}. Bleeding {vag bleed:12292}. Bowel function is {normal:32111}. Bladder function is {normal:32111}. Patient {is/is not:9024} sexually active. Contraception method is {contraceptive method:5051}. Postpartum depression screening: {gen negative/positive:315881}.   The pregnancy intention screening data noted above was reviewed. Potential methods of contraception were discussed. The patient elected to proceed with No data recorded.    Health Maintenance Due  Topic Date Due   Cervical Cancer Screening (HPV/Pap Cotest)  04/19/2023    {Common ambulatory SmartLinks:19316}  Review of Systems {ros; complete:30496}  Objective:  There were no vitals taken for this visit.   General:  {gen appearance:16600}   Breasts:  {desc; normal/abnormal/not indicated:14647}  Lungs: {lung exam:16931}  Heart:  {heart exam:5510}  Abdomen: {abdomen exam:16834}   Wound {Wound assessment:11097}  GU exam:  {desc; normal/abnormal/not indicated:14647}       Assessment:    1. Postpartum care and examination ***  2. Polysubstance abuse (HCC) ***  3. Major depressive disorder with current active episode, unspecified depression episode severity, unspecified whether recurrent ***  4. Birth control counseling ***   *** postpartum exam.   Plan:   Essential components of care per ACOG recommendations:  1.  Mood and well being: Patient with {gen negative/positive:315881} depression screening today. Reviewed local resources for support.  - Patient tobacco use? {tobacco use:25506}  - hx of drug  use? {yes/no:25505}    2. Infant care and feeding:  -Patient currently breastmilk feeding? {yes/no:25502}  -Social determinants of health (SDOH) reviewed in EPIC. No concerns***The following needs were identified***  3. Sexuality, contraception and birth spacing - Patient {DOES_DOES EAV:40981} want a pregnancy in the next year.  Desired family size is {NUMBER 1-10:22536} children.  - Reviewed reproductive life planning. Reviewed contraceptive methods based on pt preferences and effectiveness.  Patient desired {Upstream End Methods:24109} today.   - Discussed birth spacing of 18 months  4. Sleep and fatigue -Encouraged family/partner/community support of 4 hrs of uninterrupted sleep to help with mood and fatigue  5. Physical Recovery  - Discussed patients delivery and complications. She describes her labor as {description:25511} - Patient had a {CHL AMB [phone]}. Patient had a {laceration:25518} laceration. Perineal healing reviewed. Patient expressed understanding - Patient has urinary incontinence? {yes/no:25515} - Patient {ACTION; IS/IS XBJ:[phone]} safe to resume physical and sexual activity  6.  Health Maintenance - HM due items addressed {Yes or If no, why not?:20788} - Last pap smear  Diagnosis  Date Value Ref Range Status  04/18/2020   Final   - Negative for intraepithelial lesion or malignancy (NILM)   Pap smear {done:10129} at today's visit.  -Breast Cancer screening indicated? {indicated:25516}  7. Chronic Disease/Pregnancy Condition follow up: {Follow up:25499}  - PCP follow up  Vidal Schwalbe, CMA Center for [employer], [facility]

## 2023-07-14 ENCOUNTER — Other Ambulatory Visit: Payer: 59

## 2023-07-14 ENCOUNTER — Ambulatory Visit (INDEPENDENT_AMBULATORY_CARE_PROVIDER_SITE_OTHER): Payer: 59 | Admitting: Clinical

## 2023-07-14 ENCOUNTER — Other Ambulatory Visit: Payer: Self-pay | Admitting: Family Medicine

## 2023-07-14 DIAGNOSIS — F1990 Other psychoactive substance use, unspecified, uncomplicated: Secondary | ICD-10-CM | POA: Diagnosis not present

## 2023-07-14 DIAGNOSIS — F4323 Adjustment disorder with mixed anxiety and depressed mood: Secondary | ICD-10-CM | POA: Diagnosis not present

## 2023-07-14 DIAGNOSIS — Z658 Other specified problems related to psychosocial circumstances: Secondary | ICD-10-CM

## 2023-07-18 LAB — TOXASSURE FLEX 15, UR
6-ACETYLMORPHINE IA: NEGATIVE ng/mL
7-aminoclonazepam: NOT DETECTED ng/mg creat
AMPHETAMINES IA: NEGATIVE ng/mL
Alpha-hydroxyalprazolam: NOT DETECTED ng/mg creat
Alpha-hydroxymidazolam: NOT DETECTED ng/mg creat
Alpha-hydroxytriazolam: NOT DETECTED ng/mg creat
Alprazolam: NOT DETECTED ng/mg creat
BARBITURATES IA: NEGATIVE ng/mL
BUPRENORPHINE: NEGATIVE
Benzodiazepines: NEGATIVE
Buprenorphine: NOT DETECTED ng/mg creat
CANNABINOIDS IA: NEGATIVE ng/mL
COCAINE METABOLITE IA: NEGATIVE ng/mL
Clonazepam: NOT DETECTED ng/mg creat
Creatinine: 129 mg/dL
Desalkylflurazepam: NOT DETECTED ng/mg creat
Desmethyldiazepam: NOT DETECTED ng/mg creat
Desmethylflunitrazepam: NOT DETECTED ng/mg creat
Diazepam: NOT DETECTED ng/mg creat
ETHYL ALCOHOL Enzymatic: NEGATIVE g/dL
FENTANYL: NEGATIVE
Fentanyl: NOT DETECTED ng/mg creat
Flunitrazepam: NOT DETECTED ng/mg creat
Lorazepam: NOT DETECTED ng/mg creat
METHADONE IA: NEGATIVE ng/mL
METHADONE MTB IA: NEGATIVE ng/mL
Midazolam: NOT DETECTED ng/mg creat
Norbuprenorphine: NOT DETECTED ng/mg creat
Norfentanyl: NOT DETECTED ng/mg creat
OPIATE CLASS IA: NEGATIVE ng/mL
OXYCODONE CLASS IA: NEGATIVE ng/mL
Oxazepam: NOT DETECTED ng/mg creat
PHENCYCLIDINE IA: NEGATIVE ng/mL
TAPENTADOL, IA: NEGATIVE ng/mL
TRAMADOL IA: NEGATIVE ng/mL
Temazepam: NOT DETECTED ng/mg creat

## 2023-07-19 NOTE — BH Specialist Note (Unsigned)
Integrated Behavioral Health via Telemedicine Visit  07/19/2023 Sylvia Mccann [id number]  Number of Integrated Behavioral Health Clinician visits: 2- Second Visit  Session Start time: 1416   Session End time: 1448  Total time in minutes: 32   Referring Provider: *** Patient/Family location: Home*** [facility] Provider location: [facility]  All persons participating in visit:[name] ***   Types of Service: {CHL AMB TYPE OF [phone]}  I connected with Sylvia Mccann and/or Sylvia Mccann's {family members:20773} via  Telephone or Video Enabled Telemedicine Application  (Video is Caregility application) and verified that I am speaking with the correct person using two identifiers. Discussed confidentiality: Yes   I discussed the limitations of telemedicine and the availability of in person appointments.  Discussed there is a possibility of technology failure and discussed alternative modes of communication if that failure occurs.  I discussed that engaging in this telemedicine visit, they consent to the provision of behavioral healthcare and the services will be billed under their insurance.  Patient and/or legal guardian expressed understanding and consented to Telemedicine visit: Yes   Presenting Concerns: Patient and/or family reports the following symptoms/concerns: *** Duration of problem: ***; Severity of problem: {Mild/Moderate/Severe:20260}  Patient and/or Family's Strengths/Protective Factors: {CHL AMB BH PROTECTIVE FACTORS:[phone]}  Goals Addressed: Patient will:  Reduce symptoms of: {IBH Symptoms:21014056}   Increase knowledge and/or ability of: {IBH Patient Tools:21014057}   Demonstrate ability to: {IBH Goals:21014053}  Progress towards Goals: {CHL AMB BH PROGRESS TOWARDS GOALS:[id number]}  Interventions: Interventions utilized:  {IBH  Interventions:21014054} Standardized Assessments completed: {IBH Screening Tools:21014051}  Patient and/or Family Response: Patient agrees with treatment plan. ***  Assessment: Patient currently experiencing ***.   Patient may benefit from continued therapeutic intervention *** .  Plan: Follow up with behavioral health clinician on : *** Behavioral recommendations:  -*** -*** Referral(s): {IBH Referrals:21014055}  I discussed the assessment and treatment plan with the patient and/or parent/guardian. They were provided an opportunity to ask questions and all were answered. They agreed with the plan and demonstrated an understanding of the instructions.   They were advised to call back or seek an in-person evaluation if the symptoms worsen or if the condition fails to improve as anticipated.  Sylvia Lips, LCSW     07/09/2023    9:31 AM 06/23/2023    4:30 PM 04/18/2020    1:46 PM  Depression screen PHQ 2/9  Decreased Interest 0 0 3  Down, Depressed, Hopeless 1 1 2   PHQ - 2 Score 1 1 5   Altered sleeping  1 2  Tired, decreased energy  2 3  Change in appetite  2 3  Feeling bad or failure about yourself   2 2  Trouble concentrating  2 3  Moving slowly or fidgety/restless  3 0  Suicidal thoughts  0 0  PHQ-9 Score  13 18      07/09/2023    9:32 AM 06/23/2023    4:31 PM 04/18/2020    1:47 PM  GAD 7 : Generalized Anxiety Score  Nervous, Anxious, on Edge 1 1 3   Control/stop worrying 1 2 3   Worry too much - different things 1 2 3   Trouble relaxing 1 2 0  Restless 1 2 0  Easily annoyed or irritable 1 3 3   Afraid - awful might happen 0 1 0  Total GAD 7 Score 6 13 12   Anxiety Difficulty Very difficult

## 2023-07-23 ENCOUNTER — Ambulatory Visit (HOSPITAL_COMMUNITY): Payer: Self-pay

## 2023-07-23 DIAGNOSIS — R0603 Acute respiratory distress: Secondary | ICD-10-CM | POA: Diagnosis not present

## 2023-07-23 DIAGNOSIS — Z4682 Encounter for fitting and adjustment of non-vascular catheter: Secondary | ICD-10-CM | POA: Diagnosis not present

## 2023-07-23 NOTE — Lactation Note (Signed)
This note was copied from a baby's chart.  NICU Lactation Consultation Note  Patient Name: Boy Alaiya Martindelcampo ZOXWR'U Date: 07/23/2023 Age:31 wk.o.  Reason for consult: Weekly NICU follow-up; Primapara; 1st time breastfeeding; NICU baby; Preterm <34wks; Other (Comment); Late-preterm 34-36.6wks; Infant < 6lbs; Mother's request; RN request (No PNC care, prenatal alcohol and cocaine use)  SUBJECTIVE Visited with family of 20 58/25 weeks old AGA NICU female; Ms. Boffa is a P1 and called out for lactation because she had questions regarding her supply. She took a "break" from breastfeeding for 4 days and her supply went away. She was pumping about 10 ml prior this "break" but now it's barely a milliliter; she was pumping a dumping because she took alcoholic beverages and re-started pumping again yesterday, she also voiced the return of her menses. Explained that there are things we could try to increase her supply but also revised realistic expectations at 6 weeks post-partum. She wants to give breastfeeding another try because her hope is to take baby to breast once he's ready. Revised strategies to increase supply, lactogenesis III and benefits of STS care.  OBJECTIVE Infant data: Mother's Current Feeding Choice: Breast Milk and Formula  O2 Device: CPAP FiO2 (%): 25 %  Infant feeding assessment Scale for Readiness: 3   Maternal data: G1P1 Vaginal, Spontaneous Pumping frequency: 4 times/24 hours Pumped volume: 1 mL Flange Size: 21  WIC Program: Yes WIC Referral Sent?: Yes What county?: [place] (appt on 8/26, offered a WIC loaner, may want one tomorrow) Pump: WIC Pump  ASSESSMENT Infant: Feeding Status: Scheduled 8-11-2-5 Feeding method: Tube/Gavage (Bolus)  Maternal: Milk volume: Low  INTERVENTIONS/PLAN Interventions: Interventions: Breast feeding basics reviewed; Coconut oil; DEBP; Education Tools: Pump; Flanges Pump Education: Setup, frequency, and cleaning; Milk  Storage  Plan: Encouraged pumping every 3 hours, ideally 8 pumping sessions/24 hours She'll start power pumping in the AM She'll increase her fluid intake and aim for a healthy diet STS care was also strongly encouraged   Female visitor present. All questions and concerns answered, family to contact [facility] services PRN.  Consult Status: NICU follow-up NICU Follow-up type: Weekly NICU follow up   Jalexus Brett S Philis Nettle 07/23/2023, 7:12 PM

## 2023-07-26 DIAGNOSIS — J9811 Atelectasis: Secondary | ICD-10-CM | POA: Diagnosis not present

## 2023-07-26 DIAGNOSIS — Z4682 Encounter for fitting and adjustment of non-vascular catheter: Secondary | ICD-10-CM | POA: Diagnosis not present

## 2023-07-26 DIAGNOSIS — N390 Urinary tract infection, site not specified: Secondary | ICD-10-CM | POA: Diagnosis not present

## 2023-08-02 ENCOUNTER — Ambulatory Visit (INDEPENDENT_AMBULATORY_CARE_PROVIDER_SITE_OTHER): Payer: 59 | Admitting: Clinical

## 2023-08-02 DIAGNOSIS — Z4682 Encounter for fitting and adjustment of non-vascular catheter: Secondary | ICD-10-CM | POA: Diagnosis not present

## 2023-08-02 DIAGNOSIS — R140 Abdominal distension (gaseous): Secondary | ICD-10-CM | POA: Diagnosis not present

## 2023-08-02 DIAGNOSIS — Z658 Other specified problems related to psychosocial circumstances: Secondary | ICD-10-CM

## 2023-08-02 DIAGNOSIS — F17200 Nicotine dependence, unspecified, uncomplicated: Secondary | ICD-10-CM

## 2023-08-02 DIAGNOSIS — F4323 Adjustment disorder with mixed anxiety and depressed mood: Secondary | ICD-10-CM | POA: Diagnosis not present

## 2023-08-02 NOTE — Patient Instructions (Signed)
[facility] at [facility] °[address] °[place], [place] [address] °[phone] (main office) °[phone] (Ulisses Vondrak's office) ° °LIEAP (Low Income Energy Assistance Program) °https://www.ncdhhs.gov/divisions/social-services/energy-assistance/low-income-energy-assistance-lieap ° °LIHWAP (Low Income Household Water Assistance Program) °https://www.ncdhhs.gov/divisions/social-services/energy-assistance/low-income-household-water-assistance-program-lihwap ° ° °

## 2023-08-05 DIAGNOSIS — N390 Urinary tract infection, site not specified: Secondary | ICD-10-CM | POA: Diagnosis not present

## 2023-08-05 DIAGNOSIS — J9811 Atelectasis: Secondary | ICD-10-CM | POA: Diagnosis not present

## 2023-08-05 DIAGNOSIS — Z4682 Encounter for fitting and adjustment of non-vascular catheter: Secondary | ICD-10-CM | POA: Diagnosis not present

## 2023-08-09 ENCOUNTER — Encounter: Payer: Self-pay | Admitting: Family Medicine

## 2023-08-09 ENCOUNTER — Ambulatory Visit (INDEPENDENT_AMBULATORY_CARE_PROVIDER_SITE_OTHER): Payer: 59 | Admitting: Family Medicine

## 2023-08-09 VITALS — BP 118/82 | HR 99 | Temp 98.7°F | Resp 16 | Ht 65.0 in | Wt 204.4 lb

## 2023-08-09 DIAGNOSIS — F53 Postpartum depression: Secondary | ICD-10-CM | POA: Diagnosis not present

## 2023-08-09 DIAGNOSIS — F17200 Nicotine dependence, unspecified, uncomplicated: Secondary | ICD-10-CM

## 2023-08-09 DIAGNOSIS — J4520 Mild intermittent asthma, uncomplicated: Secondary | ICD-10-CM

## 2023-08-09 MED ORDER — ALBUTEROL SULFATE (2.5 MG/3ML) 0.083% IN NEBU: 2.5000 mg | INHALATION_SOLUTION | Freq: Four times a day (QID) | RESPIRATORY_TRACT | 1 refills | Status: AC | PRN

## 2023-08-09 MED ORDER — SERTRALINE HCL 25 MG PO TABS: 25.0000 mg | ORAL_TABLET | Freq: Every day | ORAL | 1 refills | Status: AC

## 2023-08-10 ENCOUNTER — Encounter: Payer: Self-pay | Admitting: Family Medicine

## 2023-08-10 NOTE — Progress Notes (Signed)
Established Patient Office Visit  Subjective    Patient ID: Sylvia Mccann, female    DOB: 07-02-1991  Age: 32 y.o. MRN: [id number]  CC:  Chief Complaint  Patient presents with   Follow-up    1 month follow up    HPI Sylvia Mccann presents for complaint of increased asthma sx over past couple of weeks. Denies fever/chills or viral sx.   Outpatient Encounter Medications as of 08/09/2023  Medication Sig   albuterol (PROVENTIL) (2.5 MG/3ML) 0.083% nebulizer solution Take 3 mLs (2.5 mg total) by nebulization every 6 (six) hours as needed for wheezing or shortness of breath.   albuterol (VENTOLIN HFA) 108 (90 Base) MCG/ACT inhaler Inhale 1-2 puffs into the lungs every 6 (six) hours as needed for wheezing or shortness of breath.   docusate sodium (COLACE) 100 MG capsule Take 1 capsule (100 mg total) by mouth 2 (two) times daily.   ibuprofen (ADVIL) 600 MG tablet Take 1 tablet (600 mg total) by mouth every 6 (six) hours.   nicotine polacrilex (NICORETTE) 4 MG gum Take 1 each (4 mg total) by mouth as needed for smoking cessation.   ondansetron (ZOFRAN-ODT) 4 MG disintegrating tablet Take 1 tablet (4 mg total) by mouth every 8 (eight) hours as needed for nausea or vomiting.   Prenatal Vit-Fe Fumarate-FA (PRENATAL VITAMIN) 27-0.8 MG TABS Take 1 tablet by mouth daily.   sertraline (ZOLOFT) 25 MG tablet Take 1 tablet (25 mg total) by mouth daily.   No facility-administered encounter medications on file as of 08/09/2023.    Past Medical History:  Diagnosis Date   Asthma    Migraine     Past Surgical History:  Procedure Laterality Date   NO PAST SURGERIES      Family History  Problem Relation Age of Onset   Schizophrenia Mother    Bipolar disorder Mother    Schizophrenia Father     Social History   Socioeconomic History   Marital status: Single    Spouse name: Not on file   Number of children: Not on file   Years of education: Not on file   Highest education level: 12th grade   Occupational History   Not on file  Tobacco Use   Smoking status: Every Day    Current packs/day: 0.50    Types: Cigarettes   Smokeless tobacco: Never  Substance and Sexual Activity   Alcohol use: Yes    Alcohol/week: 4.0 standard drinks of alcohol    Types: 2 Cans of beer, 2 Shots of liquor per week   Drug use: Yes    Types: Cocaine   Sexual activity: Yes    Birth control/protection: None  Other Topics Concern   Not on file  Social History Narrative   Not on file   Social Determinants of Health   Financial Resource Strain: Low Risk  (08/08/2023)   Overall Financial Resource Strain (CARDIA)    Difficulty of Paying Living Expenses: Not very hard  Recent Concern: Financial Resource Strain - Medium Risk (07/05/2023)   Overall Financial Resource Strain (CARDIA)    Difficulty of Paying Living Expenses: Somewhat hard  Food Insecurity: No Food Insecurity (08/08/2023)   Hunger Vital Sign    Worried About Running Out of Food in the Last Year: Never true    Ran Out of Food in the Last Year: Never true  Recent Concern: Food Insecurity - Food Insecurity Present (07/05/2023)   Hunger Vital Sign    Worried About Running Out of  Food in the Last Year: Sometimes true    Ran Out of Food in the Last Year: Sometimes true  Transportation Needs: No Transportation Needs (08/08/2023)   PRAPARE - Administrator, Civil Service (Medical): No    Lack of Transportation (Non-Medical): No  Recent Concern: Transportation Needs - Unmet Transportation Needs (07/05/2023)   PRAPARE - Transportation    Lack of Transportation (Medical): Yes    Lack of Transportation (Non-Medical): Yes  Physical Activity: Inactive (08/08/2023)   Exercise Vital Sign    Days of Exercise per Week: 0 days    Minutes of Exercise per Session: 0 min  Stress: Stress Concern Present (08/08/2023)   [employer] of Occupational Health - Occupational Stress Questionnaire    Feeling of Stress : Very much  Social  Connections: Socially Isolated (08/08/2023)   Social Connection and Isolation Panel [NHANES]    Frequency of Communication with Friends and Family: More than three times a week    Frequency of Social Gatherings with Friends and Family: Twice a week    Attends Religious Services: Never    Database administrator or Organizations: No    Attends Engineer, structural: Not on file    Marital Status: Never married  Intimate Partner Violence: Not At Risk (06/05/2023)   Humiliation, Afraid, Rape, and Kick questionnaire    Fear of Current or Ex-Partner: No    Emotionally Abused: No    Physically Abused: No    Sexually Abused: No    Review of Systems  All other systems reviewed and are negative.       Objective    BP 118/82 (BP Location: Right Arm, Patient Position: Sitting, Cuff Size: Normal)   Pulse 99   Temp 98.7 F (37.1 C) (Oral)   Resp 16   Ht 5\' 5"  (1.651 m)   Wt 204 lb 6.4 oz (92.7 kg)   SpO2 95%   BMI 34.01 kg/m   Physical Exam Vitals and nursing note reviewed.  Constitutional:      General: She is not in acute distress. Cardiovascular:     Rate and Rhythm: Normal rate and regular rhythm.  Pulmonary:     Effort: Pulmonary effort is normal.     Breath sounds: Normal breath sounds.  Abdominal:     Palpations: Abdomen is soft.     Tenderness: There is no abdominal tenderness.  Neurological:     General: No focal deficit present.     Mental Status: She is alert and oriented to person, place, and time.  Psychiatric:        Mood and Affect: Affect normal. Mood is depressed.         Assessment & Plan:  1. Mild intermittent asthma without complication Albuterol inhaler prescribed   2. Post partum depression Will start zoloft 25mg  daily.   3. Smoker Discussed reduction/cessation    Return in about 4 weeks (around 09/06/2023) for follow up.   [name], MD

## 2023-08-11 ENCOUNTER — Ambulatory Visit (HOSPITAL_COMMUNITY): Payer: Self-pay

## 2023-08-11 NOTE — Lactation Note (Signed)
This note was copied from a Sylvia's chart.  NICU Lactation Consultation Note  Patient Name: Sylvia Mccann ZOXWR'U Date: 08/11/2023 Age:31 m.o.  Reason for consult: Weekly NICU follow-up; Primapara; 1st time breastfeeding; NICU Sylvia; Early term 48-38.6wks; Other (Comment); Infant < 6lbs (No PNC, prenatal alcohol and cocaine use)  SUBJECTIVE Visited with family of 65 73/33 weeks old AGA NICU female; Sylvia Mccann is a P1 and voiced that despite pumping 5 times/24 hours she's still not getting enough drops to collect. Reviewed realistic expectations at 9 weeks post-partum and let her know that if she wants to continue trying, she can but if it's too stressful to keep going, it's OK to stop. No S/S of engorgement at this time. Sylvia "[name]" has been on 100% formula since September 28; that was the last day of any breastmilk feeding. Visitor present. All questions and concerns answered, Sylvia Mccann is aware of LC services and will contact PRN.  OBJECTIVE Infant data: Mother's Current Feeding Choice: Formula  O2 Device: HHFNC O2 Flow Rate (L/min): 4 L/min FiO2 (%): 28 %  Infant feeding assessment Scale for Readiness: 5 (HHFNC 4L- interested in pacifier)   Maternal data: G1P1 Vaginal, Spontaneous No data recorded WIC Program: Yes WIC Referral Sent?: Yes What county?: [place] (appt on 8/26, offered a WIC loaner, may want one tomorrow) Pump: WIC Pump  ASSESSMENT Infant: Feeding Status: Scheduled 8-11-2-5 Feeding method: Tube/Gavage (Bolus)  Maternal: No data recorded INTERVENTIONS/PLAN Interventions: Interventions: Education Discharge Education: Engorgement and breast care  Plan: Consult Status: Complete   Saiquan Hands S Jeda Pardue 08/11/2023, 7:07 PM

## 2023-08-23 NOTE — BH Specialist Note (Unsigned)
Integrated Behavioral Health via Telemedicine Visit  09/07/2023 Sylvia Mccann [id number]  Number of Integrated Behavioral Health Clinician visits: 4- Fourth Visit  Session Start time: 1549   Session End time: 1605  Total time in minutes: 16   Referring Provider: Merian Capron, MD Patient/Family location: [facility] Provider location: [facility]  All persons participating in visit: Patient Sylvia Mccann and [facility] Sylvia Mccann   Types of Service: Individual psychotherapy and Video visit  I connected with [name] and/or [name]'s  n/a  via  Telephone or Video Enabled Telemedicine Application  (Video is Caregility application) and verified that I am speaking with the correct person using two identifiers. Discussed confidentiality: Yes   I discussed the limitations of telemedicine and the availability of in person appointments.  Discussed there is a possibility of technology failure and discussed alternative modes of communication if that failure occurs.  I discussed that engaging in this telemedicine visit, they consent to the provision of behavioral healthcare and the services will be billed under their insurance.  Patient and/or legal guardian expressed understanding and consented to Telemedicine visit: Yes   Presenting Concerns: Patient and/or family reports the following symptoms/concerns: Waiting for baby to come home (hopefully within two weeks); using nicotine gum (doesn't like the taste, but still using); mood improvement over time.  Duration of problem: Postpartum; Severity of problem: mild  Patient and/or Family's Strengths/Protective Factors: Social connections, Concrete supports in place (healthy food, safe environments, etc.), Sense of purpose, and Physical Health (exercise, healthy diet, medication compliance, etc.)  Goals Addressed: Patient will:  Maintain reduced symptoms of:  anxiety, depression, and stress    Demonstrate ability to: Increase healthy adjustment to current life circumstances  Progress towards Goals: Ongoing  Interventions: Interventions utilized:  Supportive Reflection Standardized Assessments completed: Not Needed  Patient and/or Family Response: Patient agrees with treatment plan.   Assessment: Patient currently experiencing Adjustment disorder with mixed anxious and depressed mood; Psychosocial stress; Tobacco use disorder.   Patient may benefit from continued therapeutic intervention  .  Plan: Follow up with behavioral health clinician on : Call [name] at [phone], as needed. Behavioral recommendations:  -Continue maintaining hopeful outlook; prioritizing healthy self-care daily; nicotine gum; kindness to self -Continue to consider new mom support group(s) of choice, as needed Referral(s): Integrated Art gallery manager (In Clinic) and [facility]:  new mom support  I discussed the assessment and treatment plan with the patient and/or parent/guardian. They were provided an opportunity to ask questions and all were answered. They agreed with the plan and demonstrated an understanding of the instructions.   They were advised to call back or seek an in-person evaluation if the symptoms worsen or if the condition fails to improve as anticipated.  Rae Lips, LCSW     08/25/2023    1:41 PM 08/09/2023    9:36 AM 08/02/2023    3:56 PM 07/09/2023    9:31 AM 06/23/2023    4:30 PM  Depression screen PHQ 2/9  Decreased Interest 0 1 1 0 0  Down, Depressed, Hopeless 2 1 1 1 1   PHQ - 2 Score 2 2 2 1 1   Altered sleeping 2 1 1  1   Tired, decreased energy 2 1 1  2   Change in appetite 3 1 3  2   Feeling bad or failure about yourself  0 0 1  2  Trouble concentrating 0 1 1  2   Moving  slowly or fidgety/restless 0 0 0  3  Suicidal thoughts 0 0 0  0  PHQ-9 Score 9 6 9  13   Difficult doing work/chores  Very difficult          08/25/2023    1:42 PM 08/09/2023    9:37 AM 08/02/2023    3:58 PM 07/09/2023    9:32 AM  GAD 7 : Generalized Anxiety Score  Nervous, Anxious, on Edge 2 0 1 1  Control/stop worrying 2 1 1 1   Worry too much - different things 2 1 1 1   Trouble relaxing 1 1 1 1   Restless 0 1 0 1  Easily annoyed or irritable 0 1 3 1   Afraid - awful might happen 0 1 0 0  Total GAD 7 Score 7 6 7 6   Anxiety Difficulty  Somewhat difficult  Very difficult

## 2023-08-25 ENCOUNTER — Ambulatory Visit (INDEPENDENT_AMBULATORY_CARE_PROVIDER_SITE_OTHER): Payer: 59 | Admitting: Family Medicine

## 2023-08-25 ENCOUNTER — Other Ambulatory Visit (HOSPITAL_COMMUNITY)
Admission: RE | Admit: 2023-08-25 | Discharge: 2023-08-25 | Disposition: A | Discharge status: Home / Self Care | Payer: 59 | Source: Ambulatory Visit | Attending: Family Medicine | Admitting: Family Medicine

## 2023-08-25 ENCOUNTER — Encounter: Payer: Self-pay | Admitting: Family Medicine

## 2023-08-25 VITALS — BP 108/73 | HR 88 | Ht 65.0 in | Wt 205.7 lb

## 2023-08-25 DIAGNOSIS — Z124 Encounter for screening for malignant neoplasm of cervix: Secondary | ICD-10-CM | POA: Diagnosis not present

## 2023-08-25 DIAGNOSIS — Z01419 Encounter for gynecological examination (general) (routine) without abnormal findings: Secondary | ICD-10-CM

## 2023-08-25 DIAGNOSIS — F1910 Other psychoactive substance abuse, uncomplicated: Secondary | ICD-10-CM | POA: Diagnosis not present

## 2023-08-25 DIAGNOSIS — Z113 Encounter for screening for infections with a predominantly sexual mode of transmission: Secondary | ICD-10-CM | POA: Diagnosis not present

## 2023-08-26 LAB — HEPATITIS B SURFACE ANTIGEN: Hepatitis B Surface Ag: NEGATIVE

## 2023-08-26 LAB — RPR: RPR Ser Ql: NONREACTIVE

## 2023-08-26 LAB — HEPATITIS C ANTIBODY: Hep C Virus Ab: NONREACTIVE

## 2023-08-26 LAB — HIV ANTIBODY (ROUTINE TESTING W REFLEX): HIV Screen 4th Generation wRfx: NONREACTIVE

## 2023-08-26 NOTE — Progress Notes (Signed)
ANNUAL EXAM Patient name: Sylvia Mccann MRN [id number]  Date of birth: [birth year] Chief Complaint:   No chief complaint on file.  History of Present Illness:   Sylvia Mccann is a 32 y.o. G1P1 African-American female being seen today for a routine annual exam.  Current complaints: None  Patient's last menstrual period was 08/11/2023 (approximate).   The pregnancy intention screening data noted above was reviewed. Potential methods of contraception were discussed. The patient elected to proceed with No data recorded.   Last pap 04/18/2020. Results were: NILM w/ HRHPV not done. H/O abnormal pap: no     08/25/2023    1:41 PM 08/09/2023    9:36 AM 08/02/2023    3:56 PM 07/09/2023    9:31 AM 06/23/2023    4:30 PM  Depression screen PHQ 2/9  Decreased Interest 0 1 1 0 0  Down, Depressed, Hopeless 2 1 1 1 1   PHQ - 2 Score 2 2 2 1 1   Altered sleeping 2 1 1  1   Tired, decreased energy 2 1 1  2   Change in appetite 3 1 3  2   Feeling bad or failure about yourself  0 0 1  2  Trouble concentrating 0 1 1  2   Moving slowly or fidgety/restless 0 0 0  3  Suicidal thoughts 0 0 0  0  PHQ-9 Score 9 6 9  13   Difficult doing work/chores  Very difficult           08/25/2023    1:42 PM 08/09/2023    9:37 AM 08/02/2023    3:58 PM 07/09/2023    9:32 AM  GAD 7 : Generalized Anxiety Score  Nervous, Anxious, on Edge 2 0 1 1  Control/stop worrying 2 1 1 1   Worry too much - different things 2 1 1 1   Trouble relaxing 1 1 1 1   Restless 0 1 0 1  Easily annoyed or irritable 0 1 3 1   Afraid - awful might happen 0 1 0 0  Total GAD 7 Score 7 6 7 6   Anxiety Difficulty  Somewhat difficult  Very difficult     Review of Systems:   Pertinent items are noted in HPI Denies any headaches, blurred vision, fatigue, shortness of breath, chest pain, abdominal pain, abnormal vaginal discharge/itching/odor/irritation, problems with periods, bowel movements, urination, or intercourse unless otherwise stated  above. Pertinent History Reviewed:  Reviewed past medical,surgical, social and family history.  Reviewed problem list, medications and allergies. Physical Assessment:   Vitals:   08/25/23 1331  BP: 108/73  Pulse: 88  Weight: 205 lb 11.2 oz (93.3 kg)  Height: 5\' 5"  (1.651 m)  Body mass index is 34.23 kg/m.        Physical Examination:   General appearance - well appearing, and in no distress  Mental status - alert, oriented to person, place, and time  Psych:  She has a normal mood and affect  Skin - warm and dry, normal color, no suspicious lesions noted  Chest - effort normal, all lung fields clear to auscultation bilaterally  Heart - normal rate and regular rhythm  Neck:  midline trachea, no thyromegaly or nodules  Breasts - breasts appear normal, no suspicious masses, no skin or nipple changes or  axillary nodes  Abdomen - soft, nontender, nondistended, no masses or organomegaly  Pelvic - VULVA: normal appearing vulva with no masses, tenderness or lesions  VAGINA: normal appearing vagina with normal color and discharge, no lesions  CERVIX:  normal appearing cervix without discharge or lesions, no CMT  Thin prep pap is done HR HPV cotesting  UTERUS: uterus is felt to be normal size, shape, consistency and nontender   ADNEXA: No adnexal masses or tenderness noted.  Extremities:  No swelling or varicosities noted  Chaperone present for exam  No results found for this or any previous visit (from the past 24 hour(s)).  Assessment & Plan:  1) Well-Woman Exam  2) screening for STI Patient requesting STI screening today including blood work.  Labs ordered and will call patient with results of any abnormalities.  3) history of polysubstance abuse Patient requesting drug screen today.  Reports having custody hearing soon and she will need drug screen results for this.  Labs/procedures today: UDS, wet prep, GC/chlamydia, Pap, HIV, RPR   Orders Placed This Encounter  Procedures    Hepatitis B Surface AntiGEN   Hepatitis C Antibody   HIV Antibody (routine testing w rflx)   RPR   ToxAssure Flex 15, Ur    Meds: No orders of the defined types were placed in this encounter.   Follow-up: No follow-ups on file.  Celedonio Savage, MD 08/26/2023 3:33 PM

## 2023-08-27 LAB — CYTOLOGY - PAP
Chlamydia: NEGATIVE
Comment: NEGATIVE
Comment: NEGATIVE
Comment: NORMAL
Diagnosis: NEGATIVE
Neisseria Gonorrhea: NEGATIVE
Trichomonas: NEGATIVE

## 2023-08-29 LAB — TOXASSURE FLEX 15, UR
6-ACETYLMORPHINE IA: NEGATIVE ng/mL
7-aminoclonazepam: NOT DETECTED ng/mg{creat}
AMPHETAMINES IA: NEGATIVE ng/mL
Alpha-hydroxyalprazolam: NOT DETECTED ng/mg{creat}
Alpha-hydroxymidazolam: NOT DETECTED ng/mg{creat}
Alpha-hydroxytriazolam: NOT DETECTED ng/mg{creat}
Alprazolam: NOT DETECTED ng/mg{creat}
BARBITURATES IA: NEGATIVE ng/mL
BUPRENORPHINE: NEGATIVE
Benzodiazepines: NEGATIVE
Buprenorphine: NOT DETECTED ng/mg{creat}
CANNABINOIDS IA: NEGATIVE ng/mL
COCAINE METABOLITE IA: NEGATIVE ng/mL
Clonazepam: NOT DETECTED ng/mg{creat}
Creatinine: 174 mg/dL
Desalkylflurazepam: NOT DETECTED ng/mg{creat}
Desmethyldiazepam: NOT DETECTED ng/mg{creat}
Desmethylflunitrazepam: NOT DETECTED ng/mg{creat}
Diazepam: NOT DETECTED ng/mg{creat}
ETHYL ALCOHOL Enzymatic: NEGATIVE g/dL
FENTANYL: NEGATIVE
Fentanyl: NOT DETECTED ng/mg{creat}
Flunitrazepam: NOT DETECTED ng/mg{creat}
Lorazepam: NOT DETECTED ng/mg{creat}
METHADONE IA: NEGATIVE ng/mL
METHADONE MTB IA: NEGATIVE ng/mL
Midazolam: NOT DETECTED ng/mg{creat}
Norbuprenorphine: NOT DETECTED ng/mg{creat}
Norfentanyl: NOT DETECTED ng/mg{creat}
OPIATE CLASS IA: NEGATIVE ng/mL
OXYCODONE CLASS IA: NEGATIVE ng/mL
Oxazepam: NOT DETECTED ng/mg{creat}
PHENCYCLIDINE IA: NEGATIVE ng/mL
TAPENTADOL, IA: NEGATIVE ng/mL
TRAMADOL IA: NEGATIVE ng/mL
Temazepam: NOT DETECTED ng/mg{creat}

## 2023-08-31 ENCOUNTER — Other Ambulatory Visit: Payer: Self-pay | Admitting: Family Medicine

## 2023-09-03 ENCOUNTER — Ambulatory Visit: Payer: 59 | Admitting: Family

## 2023-09-06 ENCOUNTER — Ambulatory Visit (INDEPENDENT_AMBULATORY_CARE_PROVIDER_SITE_OTHER): Payer: 59 | Admitting: Clinical

## 2023-09-06 DIAGNOSIS — Z658 Other specified problems related to psychosocial circumstances: Secondary | ICD-10-CM

## 2023-09-06 DIAGNOSIS — F4323 Adjustment disorder with mixed anxiety and depressed mood: Secondary | ICD-10-CM | POA: Diagnosis not present

## 2023-09-06 DIAGNOSIS — F17200 Nicotine dependence, unspecified, uncomplicated: Secondary | ICD-10-CM

## 2023-09-07 NOTE — Patient Instructions (Signed)
[facility] at [facility] [address] [place], [place] [address] [phone] (main office) [phone] (Sedalia Greeson's office)  New Parent Support Groups [email or web address]   

## 2023-09-13 ENCOUNTER — Ambulatory Visit: Payer: 59 | Admitting: Family Medicine

## 2023-09-20 ENCOUNTER — Other Ambulatory Visit (HOSPITAL_COMMUNITY): Payer: Self-pay

## 2023-10-01 ENCOUNTER — Encounter (HOSPITAL_COMMUNITY): Payer: Self-pay

## 2023-10-10 ENCOUNTER — Other Ambulatory Visit: Payer: Self-pay | Admitting: Family Medicine

## 2023-10-27 ENCOUNTER — Other Ambulatory Visit: Payer: Self-pay | Admitting: Family Medicine

## 2023-11-30 ENCOUNTER — Ambulatory Visit: Payer: 59

## 2023-11-30 DIAGNOSIS — Z111 Encounter for screening for respiratory tuberculosis: Secondary | ICD-10-CM | POA: Diagnosis not present

## 2023-11-30 DIAGNOSIS — J4520 Mild intermittent asthma, uncomplicated: Secondary | ICD-10-CM

## 2023-11-30 MED ORDER — ALBUTEROL SULFATE HFA 108 (90 BASE) MCG/ACT IN AERS: 1.0000 | INHALATION_SPRAY | Freq: Four times a day (QID) | RESPIRATORY_TRACT | 1 refills | Status: DC | PRN

## 2023-12-03 ENCOUNTER — Ambulatory Visit (INDEPENDENT_AMBULATORY_CARE_PROVIDER_SITE_OTHER): Payer: 59 | Admitting: Family

## 2023-12-03 ENCOUNTER — Encounter: Payer: Self-pay | Admitting: Family Medicine

## 2023-12-03 ENCOUNTER — Encounter: Payer: Self-pay | Admitting: Family

## 2023-12-03 LAB — TB SKIN TEST
Induration: 0 mm
TB Skin Test: NEGATIVE

## 2023-12-07 ENCOUNTER — Other Ambulatory Visit: Payer: Self-pay | Admitting: Family Medicine

## 2023-12-07 DIAGNOSIS — J4520 Mild intermittent asthma, uncomplicated: Secondary | ICD-10-CM

## 2024-03-01 ENCOUNTER — Other Ambulatory Visit: Payer: Self-pay | Admitting: Family Medicine

## 2024-03-01 DIAGNOSIS — J4520 Mild intermittent asthma, uncomplicated: Secondary | ICD-10-CM

## 2024-11-16 ENCOUNTER — Encounter (HOSPITAL_COMMUNITY): Payer: Self-pay | Admitting: Obstetrics & Gynecology

## 2024-11-16 ENCOUNTER — Inpatient Hospital Stay (HOSPITAL_COMMUNITY)

## 2024-11-16 ENCOUNTER — Other Ambulatory Visit: Payer: Self-pay

## 2024-11-16 ENCOUNTER — Inpatient Hospital Stay (HOSPITAL_COMMUNITY)
Admission: AD | Admit: 2024-11-16 | Discharge: 2024-11-16 | Disposition: A | Discharge status: Home / Self Care | Attending: Obstetrics & Gynecology | Admitting: Obstetrics & Gynecology

## 2024-11-16 ENCOUNTER — Ambulatory Visit (HOSPITAL_COMMUNITY)
Admission: EM | Admit: 2024-11-16 | Discharge: 2024-11-16 | Disposition: A | Discharge status: Home / Self Care | Attending: Nurse Practitioner | Admitting: Nurse Practitioner

## 2024-11-16 DIAGNOSIS — Z363 Encounter for antenatal screening for malformations: Secondary | ICD-10-CM | POA: Insufficient documentation

## 2024-11-16 DIAGNOSIS — Z3490 Encounter for supervision of normal pregnancy, unspecified, unspecified trimester: Secondary | ICD-10-CM

## 2024-11-16 DIAGNOSIS — O212 Late vomiting of pregnancy: Secondary | ICD-10-CM | POA: Diagnosis present

## 2024-11-16 DIAGNOSIS — Z5900 Homelessness unspecified: Secondary | ICD-10-CM | POA: Diagnosis not present

## 2024-11-16 DIAGNOSIS — O219 Vomiting of pregnancy, unspecified: Secondary | ICD-10-CM | POA: Diagnosis not present

## 2024-11-16 DIAGNOSIS — F17210 Nicotine dependence, cigarettes, uncomplicated: Secondary | ICD-10-CM | POA: Diagnosis not present

## 2024-11-16 DIAGNOSIS — Z3687 Encounter for antenatal screening for uncertain dates: Secondary | ICD-10-CM

## 2024-11-16 DIAGNOSIS — F1090 Alcohol use, unspecified, uncomplicated: Secondary | ICD-10-CM | POA: Insufficient documentation

## 2024-11-16 DIAGNOSIS — O99332 Smoking (tobacco) complicating pregnancy, second trimester: Secondary | ICD-10-CM | POA: Diagnosis not present

## 2024-11-16 DIAGNOSIS — F1490 Cocaine use, unspecified, uncomplicated: Secondary | ICD-10-CM | POA: Insufficient documentation

## 2024-11-16 DIAGNOSIS — O0932 Supervision of pregnancy with insufficient antenatal care, second trimester: Secondary | ICD-10-CM

## 2024-11-16 DIAGNOSIS — Z3A26 26 weeks gestation of pregnancy: Secondary | ICD-10-CM | POA: Diagnosis not present

## 2024-11-16 DIAGNOSIS — Z3201 Encounter for pregnancy test, result positive: Secondary | ICD-10-CM

## 2024-11-16 DIAGNOSIS — N912 Amenorrhea, unspecified: Secondary | ICD-10-CM

## 2024-11-16 DIAGNOSIS — O0930 Supervision of pregnancy with insufficient antenatal care, unspecified trimester: Secondary | ICD-10-CM

## 2024-11-16 DIAGNOSIS — Z3A27 27 weeks gestation of pregnancy: Secondary | ICD-10-CM

## 2024-11-16 DIAGNOSIS — O99330 Smoking (tobacco) complicating pregnancy, unspecified trimester: Secondary | ICD-10-CM

## 2024-11-16 DIAGNOSIS — O99312 Alcohol use complicating pregnancy, second trimester: Secondary | ICD-10-CM | POA: Diagnosis not present

## 2024-11-16 DIAGNOSIS — O99322 Drug use complicating pregnancy, second trimester: Secondary | ICD-10-CM | POA: Insufficient documentation

## 2024-11-16 DIAGNOSIS — O09292 Supervision of pregnancy with other poor reproductive or obstetric history, second trimester: Secondary | ICD-10-CM | POA: Diagnosis not present

## 2024-11-16 DIAGNOSIS — F1910 Other psychoactive substance abuse, uncomplicated: Secondary | ICD-10-CM

## 2024-11-16 LAB — URINALYSIS, ROUTINE W REFLEX MICROSCOPIC
Bilirubin Urine: NEGATIVE
Glucose, UA: NEGATIVE mg/dL
Hgb urine dipstick: NEGATIVE
Ketones, ur: NEGATIVE mg/dL
Nitrite: NEGATIVE
Protein, ur: NEGATIVE mg/dL
Specific Gravity, Urine: 1.008 (ref 1.005–1.030)
pH: 7 (ref 5.0–8.0)

## 2024-11-16 LAB — PROTEIN / CREATININE RATIO, URINE
Creatinine, Urine: 76 mg/dL
Protein Creatinine Ratio: 0.1 mg/mg
Total Protein, Urine: 10 mg/dL

## 2024-11-16 LAB — COMPREHENSIVE METABOLIC PANEL WITH GFR
ALT: 25 U/L (ref 0–44)
AST: 20 U/L (ref 15–41)
Albumin: 3.5 g/dL (ref 3.5–5.0)
Alkaline Phosphatase: 105 U/L (ref 38–126)
Anion gap: 12 (ref 5–15)
BUN: <5 mg/dL — ABNORMAL LOW (ref 6–20)
CO2: 24 mmol/L (ref 22–32)
Calcium: 9.4 mg/dL (ref 8.9–10.3)
Chloride: 100 mmol/L (ref 98–111)
Creatinine, Ser: 0.72 mg/dL (ref 0.44–1.00)
GFR, Estimated: >60 mL/min
Glucose, Bld: 88 mg/dL (ref 70–99)
Potassium: 3.5 mmol/L (ref 3.5–5.1)
Sodium: 136 mmol/L (ref 135–145)
Total Bilirubin: 0.3 mg/dL (ref 0.0–1.2)
Total Protein: 6.8 g/dL (ref 6.5–8.1)

## 2024-11-16 LAB — WET PREP, GENITAL
Clue Cells Wet Prep HPF POC: NONE SEEN
Sperm: NONE SEEN
Trich, Wet Prep: NONE SEEN
WBC, Wet Prep HPF POC: <10
Yeast Wet Prep HPF POC: NONE SEEN

## 2024-11-16 LAB — POCT URINE PREGNANCY: Preg Test, Ur: POSITIVE — AB

## 2024-11-16 LAB — CBC
HCT: 35.5 % — ABNORMAL LOW (ref 36.0–46.0)
Hemoglobin: 11.7 g/dL — ABNORMAL LOW (ref 12.0–15.0)
MCH: 30 pg (ref 26.0–34.0)
MCHC: 33 g/dL (ref 30.0–36.0)
MCV: 91 fL (ref 80.0–100.0)
Platelets: 339 K/uL (ref 150–400)
RBC: 3.9 MIL/uL (ref 3.87–5.11)
RDW: 12.7 % (ref 11.5–15.5)
WBC: 11.6 K/uL — ABNORMAL HIGH (ref 4.0–10.5)
nRBC: 0 % (ref 0.0–0.2)

## 2024-11-16 LAB — HEMOGLOBIN A1C
Hgb A1c MFr Bld: 4.9 % (ref 4.8–5.6)
Mean Plasma Glucose: 93.93 mg/dL

## 2024-11-16 LAB — URINE DRUG SCREEN
Amphetamines: NEGATIVE
Barbiturates: NEGATIVE
Benzodiazepines: NEGATIVE
Cocaine: POSITIVE — AB
Fentanyl: NEGATIVE
Methadone Scn, Ur: NEGATIVE
Opiates: NEGATIVE
Tetrahydrocannabinol: NEGATIVE

## 2024-11-16 MED ORDER — ONDANSETRON 4 MG PO TBDP: 4.0000 mg | ORAL_TABLET | Freq: Once | ORAL | Status: DC

## 2024-11-16 MED ORDER — FAMOTIDINE 20 MG PO TABS: 20.0000 mg | ORAL_TABLET | Freq: Two times a day (BID) | ORAL | 3 refills | Status: AC

## 2024-11-16 MED ORDER — FAMOTIDINE 20 MG PO TABS
20.0000 mg | ORAL_TABLET | Freq: Two times a day (BID) | ORAL | Status: DC
  Administered 2024-11-16: 20 mg via ORAL
  Filled 2024-11-16: qty 1

## 2024-11-16 NOTE — ED Provider Notes (Signed)
 " [facility]    CSN: [id number] Arrival date & time: 11/16/24  1412      History   Chief Complaint Chief Complaint  Patient presents with   Possible Pregnancy    HPI Sylvia Mccann is a 34 y.o. female.   Patient presents today requesting pregnancy test.  Reports she does not know when her last menstrual cycle was as she has irregular menstrual cycles.  Reports for the past few days, she has felt worsening acid reflux and flutters in her abdomen and thinks that she may be pregnant.  She has not done any pregnancy testing at home.    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 Authorizing Provider  acetaminophen  (TYLENOL ) 500 MG tablet Take 500 mg by mouth every 6 (six) hours as needed.    [provider]  albuterol  (PROVENTIL ) (2.5 MG/3ML) 0.083% nebulizer solution Take 3 mLs (2.5 mg total) by nebulization every 6 (six) hours as needed for wheezing or shortness of breath. 08/09/23   Tanda Bleacher, MD  albuterol  (VENTOLIN  HFA) 108 [phone] Base) MCG/ACT inhaler INHALE 1-2 PUFFS EVERY 6HRS AS NEEDED FOR WHEEZING/SHORNTESS OF BREATH 03/01/24   Tanda Bleacher, MD  docusate sodium  (COLACE) 100 MG capsule Take 1 capsule (100 mg total)  by mouth 2 (two) times daily. Patient not taking: Reported on 11/16/2024 07/13/23   Emilio Delilah HERO, CNM  ibuprofen  (ADVIL ) 600 MG tablet Take 1 tablet (600 mg total) by mouth every 6 (six) hours. Patient not taking: Reported on 11/16/2024 06/07/23   Zina Jerilynn LABOR, MD  nicotine  polacrilex (NICORETTE ) 4 MG gum Take 1 each (4 mg total) by mouth as needed for smoking cessation. 07/09/23   Tanda Bleacher, MD  ondansetron  (ZOFRAN -ODT) 4 MG disintegrating tablet Take 1 tablet (4 mg total) by mouth every 8 (eight) hours as needed for nausea or vomiting. Patient not taking: Reported on 11/16/2024 06/25/23   Chandra Harlene LABOR, NP  Prenatal Vit-Fe Fumarate-FA (PRENATAL VITAMIN) 27-0.8 MG TABS Take 1 tablet by mouth daily. Patient not taking: Reported on 11/16/2024 07/09/23   Tanda Bleacher, MD  sertraline  (ZOLOFT ) 25 MG tablet Take 1 tablet (25 mg total) by mouth daily. Patient not taking: Reported on 11/16/2024 08/09/23   Tanda Bleacher, MD    Family History Family History  Problem Relation Age of Onset   Schizophrenia Mother    Bipolar disorder Mother    Schizophrenia Father     Social  History Social History[1]   Allergies   Diclofenac potassium(migraine)   Review of Systems Review of Systems Per HPI  Physical Exam Triage Vital Signs ED Triage Vitals [11/16/24 1454]  Encounter Vitals Group     BP 101/69     Girls Systolic BP Percentile      Girls Diastolic BP Percentile      Boys Systolic BP Percentile      Boys Diastolic BP Percentile      Pulse Rate 76     Resp 18     Temp 97.8 F (36.6 C)     Temp Source Oral     SpO2 99 %     Weight      Height      Head Circumference      Peak Flow      Pain Score      Pain Loc      Pain Education      Exclude from Growth Chart    No data found.  Updated Vital Signs BP 101/69 (BP Location: Left Arm)   Pulse 76   Temp 97.8 F (36.6 C) (Oral)   Resp 18   LMP  (LMP Unknown)   SpO2 99%   Visual Acuity Right Eye Distance:    Left Eye Distance:   Bilateral Distance:    Right Eye Near:   Left Eye Near:    Bilateral Near:     Physical Exam Vitals and nursing note reviewed.  Constitutional:      General: She is not in acute distress.    Appearance: Normal appearance. She is not toxic-appearing.  HENT:     Mouth/Throat:     Mouth: Mucous membranes are moist.     Pharynx: Oropharynx is clear.  Abdominal:     Comments: gravid  Skin:    General: Skin is warm and dry.     Coloration: Skin is not jaundiced or pale.     Findings: No erythema.  Neurological:     Mental Status: She is alert and oriented to person, place, and time.     Motor: No weakness.     Gait: Gait normal.  Psychiatric:        Mood and Affect: Mood normal.        Behavior: Behavior is cooperative.      UC Treatments / Results  Labs (all labs ordered are listed, but only abnormal results are displayed) Labs Reviewed  POCT URINE PREGNANCY - Abnormal; Notable for the following components:      Result Value   Preg Test, Ur Positive (*)    All other components within normal limits    EKG   Radiology No results found.  Procedures Procedures (including critical care time)  Medications Ordered in UC Medications - No data to display  Initial Impression / Assessment and Plan / UC Course  I have reviewed the triage vital signs and the nursing notes.  Pertinent labs & imaging results that were available during my care of the patient were reviewed by me and considered in my medical decision making (see chart for details).   Patient is a pleasant, well-appearing 34 year old female with stable vital signs presenting today for possible pregnancy.  Urine pregnancy test is positive.  Given no known last menstrual cycle and patient appears gravid, I recommended evaluation in the maternal admission unit/women's ER to determine dating and to begin prenatal care.  Patient is in agreement to plan.  She is safe to transport via  private  vehicle.  The patient was given the opportunity to ask questions.  All questions answered to their satisfaction.  The patient is in agreement to this plan.   Final Clinical Impressions(s) / UC Diagnoses   Final diagnoses:  Amenorrhea  Positive pregnancy test     Discharge Instructions      Please go to MAU for further evaluation and management     ED Prescriptions   None    PDMP not reviewed this encounter.    [1]  Social History Tobacco Use   Smoking status: Some Days    Current packs/day: 0.50    Types: Cigarettes   Smokeless tobacco: Never  Vaping Use   Vaping status: Never Used  Substance Use Topics   Alcohol use: Yes    Alcohol/week: 4.0 standard drinks of alcohol    Types: 2 Cans of beer, 2 Shots of liquor per week    Comment: socially   Drug use: Not Currently    Types: Cocaine     Chandra Harlene LABOR, NP 11/16/24 1623  "

## 2024-11-16 NOTE — MAU Provider Note (Signed)
 " History     CSN: [id number]  Arrival date and time: 11/16/24 1533   None     Chief Complaint  Patient presents with   How far along I am   HPI Patient is a 34 year old G2, P1 at 26 weeks and 4 days presenting after having a positive pregnancy test at an urgent care.  Initially had no complaints but then started vomiting so moved to room.  Patient reports she has had no prenatal care because she did not know she was pregnant.  She does have history of polysubstance use and reports use of crack cocaine every other day along with small amounts of occasional alcohol use and she smokes cigarettes regularly.  She is also battling with homelessness but does report she has family members That she can stay on.  She has a 75-month-old that was born at approximately 28 weeks with no prenatal care.  OB History     Gravida  2   Para  1   Term      Preterm      AB      Living  1      SAB      IAB      Ectopic      Multiple      Live Births  1           Past Medical History:  Diagnosis Date   Asthma    Migraine     Past Surgical History:  Procedure Laterality Date   NO PAST SURGERIES      Family History  Problem Relation Age of Onset   Schizophrenia Mother    Bipolar disorder Mother    Schizophrenia Father     Social History[1]  Allergies: Allergies[2]  Medications Prior to Admission  Medication Sig Dispense Refill Last Dose/Taking   acetaminophen  (TYLENOL ) 500 MG tablet Take 500 mg by mouth every 6 (six) hours as needed.   Past Week   albuterol  (PROVENTIL ) (2.5 MG/3ML) 0.083% nebulizer solution Take 3 mLs (2.5 mg total) by nebulization every 6 (six) hours as needed for wheezing or shortness of breath. 150 mL 1 Past Month   albuterol  (VENTOLIN  HFA) 108 (90 Base) MCG/ACT inhaler INHALE 1-2 PUFFS EVERY 6HRS AS NEEDED FOR WHEEZING/SHORNTESS OF BREATH 18 each 0 Past Month   docusate sodium  (COLACE) 100 MG capsule Take 1 capsule (100 mg total) by mouth 2 (two)  times daily. (Patient not taking: Reported on 11/16/2024) 60 capsule 0 Not Taking   ibuprofen  (ADVIL ) 600 MG tablet Take 1 tablet (600 mg total) by mouth every 6 (six) hours. (Patient not taking: Reported on 11/16/2024) 30 tablet 0 Not Taking   nicotine  polacrilex (NICORETTE ) 4 MG gum Take 1 each (4 mg total) by mouth as needed for smoking cessation. (Patient not taking: Reported on 11/16/2024) 100 tablet 1 Not Taking   ondansetron  (ZOFRAN -ODT) 4 MG disintegrating tablet Take 1 tablet (4 mg total) by mouth every 8 (eight) hours as needed for nausea or vomiting. (Patient not taking: Reported on 11/16/2024) 20 tablet 0 Not Taking   Prenatal Vit-Fe Fumarate-FA (PRENATAL VITAMIN) 27-0.8 MG TABS Take 1 tablet by mouth daily. (Patient not taking: Reported on 11/16/2024) 90 tablet 1 Not Taking   sertraline  (ZOLOFT ) 25 MG tablet Take 1 tablet (25 mg total) by mouth daily. (Patient not taking: Reported on 11/16/2024) 30 tablet 1 Not Taking    Review of Systems  Gastrointestinal:  Positive for nausea and vomiting. Negative for abdominal pain.  Genitourinary:  Negative for vaginal bleeding.   Physical Exam   Blood pressure 112/70, pulse 95, temperature 98.7 F (37.1 C), temperature source Oral, resp. rate 16, height 5' 5 (1.651 m), weight 95.5 kg, SpO2 98%, not currently breastfeeding.  Physical Exam Vitals and nursing note reviewed.  Constitutional:      Appearance: Normal appearance.  HENT:     Head: Normocephalic and atraumatic.     Nose: No congestion or rhinorrhea.  Eyes:     Extraocular Movements: Extraocular movements intact.  Cardiovascular:     Rate and Rhythm: Normal rate.  Pulmonary:     Effort: Pulmonary effort is normal.  Abdominal:     Palpations: Abdomen is soft.     Tenderness: There is no abdominal tenderness.     Comments: Gravid with fundal height measuring approximately 30 cm  Musculoskeletal:        General: Normal range of motion.     Cervical back: Normal range of motion.   Skin:    General: Skin is warm.     Capillary Refill: Capillary refill takes less than 2 seconds.  Neurological:     General: No focal deficit present.     Mental Status: She is alert.     Cranial Nerves: No cranial nerve deficit.  Psychiatric:        Mood and Affect: Mood normal.        Behavior: Behavior normal.    NST-120 bpm, accelerations, no decelerations, moderate variability, no contractions MAU Course  Procedures  MDM CBC CMP A1c Anatomy ultrasound RPR HIV NST  Assessment and Plan  Sylvia Mccann is a 34 year old G2, P1 at 26 weeks presenting after finding out she was pregnant.  Nausea and vomiting while in triage.  No prenatal care 26-week pregnancy Polysubstance abuse Nausea and vomiting Patient presenting after finding out she was pregnant at an urgent care.  Initially had no complaints and while discussing how far along she may be patient reporting sudden onset of nausea and started vomiting.  Patient moved to a room and NST performed.  Patient reports she has had no prenatal care and has been using crack cocaine as well as alcohol.  She has no idea how far along she is but based on her fundal height approximately 30 weeks but given body habitus difficult to appropriately determine.  Anatomy ultrasound obtained with best EDD 26 weeks and 4 days.  CBC within normal limits,Urinalysis overall within normal limits, UDS positive for cocaine.  WBCs mildly elevated hemoglobin 11.7 but otherwise within normal limits.  Message sent to clinic to have her scheduled as soon as possible for initial prenatal visit.  Discussed at length cessation of polysubstance use and patient reports that she is done using the substances and will remain clean.  Discussed tricked return precautions.  No further questions or concerns.  Patient discharged home.  Sylvia Mccann 11/16/2024, 6:29 PM     [1]  Social History Tobacco Use   Smoking status: Every Day    Current packs/day: 0.50     Types: Cigarettes   Smokeless tobacco: Never  Vaping Use   Vaping status: Never Used  Substance Use Topics   Alcohol use: Yes    Alcohol/week: 4.0 standard drinks of alcohol    Types: 2 Cans of beer, 2 Shots of liquor per week    Comment: socially   Drug use: Yes    Types: Crack cocaine  [2]  Allergies Allergen Reactions   Diclofenac Potassium(Migraine) Other (  See Comments)    'MADE ME FEEL LIKE MY HEAD WAS ON FIRE   "

## 2024-11-16 NOTE — ED Triage Notes (Signed)
 Patient presents for a UPT. Last known menstrual cycle is unknown.

## 2024-11-16 NOTE — Discharge Instructions (Signed)
 It was a pleasure taking care of you today.  You are between 26 and [redacted] weeks pregnant and we are waiting for the official read on the ultrasound.  You need to establish care as soon as possible and I have sent a message to the clinic for them to contact you to for the initial prenatal visit.  I strongly recommend you stay sober and abstain from all the substances you have been using including the tobacco use.  If you have any bleeding, leaking, contracting you need to return to the MAU.  I will see you in clinic as soon as we can get you scheduled.

## 2024-11-16 NOTE — Discharge Instructions (Signed)
 Please go to MAU for further evaluation and management

## 2024-11-16 NOTE — MAU Note (Signed)
 Sylvia Mccann is a 34 y.o. at Unknown here in MAU reporting: coming from urgent care after Pos UPT there and need to find out how far along I am. Denies current N/V . Reports moving and homelessness. Currently having a place to stay.   LMP: no estimate able to be given Onset of complaint: couple of days ago Pain score: 0/10 Vitals:   11/16/24 1615  BP: 116/75  Pulse: 92  Resp: 16  Temp: 98.7 F (37.1 C)  SpO2: 100%     FHT: na  Lab orders placed from triage: na

## 2024-11-17 LAB — GC/CHLAMYDIA PROBE AMP (~~LOC~~) NOT AT ARMC
Chlamydia: NEGATIVE
Comment: NEGATIVE
Comment: NORMAL
Neisseria Gonorrhea: NEGATIVE

## 2024-11-17 LAB — SYPHILIS: RPR W/REFLEX TO RPR TITER AND TREPONEMAL ANTIBODIES, TRADITIONAL SCREENING AND DIAGNOSIS ALGORITHM: RPR Ser Ql: NONREACTIVE

## 2024-11-20 ENCOUNTER — Encounter: Admitting: Family Medicine

## 2024-11-20 LAB — MISC LABCORP TEST (SEND OUT): Labcorp test code: 83935

## 2024-11-21 ENCOUNTER — Other Ambulatory Visit: Payer: Self-pay | Admitting: Family Medicine

## 2024-11-21 DIAGNOSIS — J4520 Mild intermittent asthma, uncomplicated: Secondary | ICD-10-CM

## 2024-11-21 NOTE — Telephone Encounter (Signed)
 Copied from CRM #[id number]. Topic: Clinical - Medication Refill >> Nov 21, 2024  3:39 PM Santiya F wrote: Medication: albuterol  (VENTOLIN  HFA) 108 (90 Base) MCG/ACT inhaler [[id number]]  Has the patient contacted their pharmacy? Yes  (Agent: If yes, when and what did the pharmacy advise?) contact office   This is the patient's preferred pharmacy:  [id number] - [place], [place] - [address] AT [facility] OF [address] [place] [address] Phone: [phone] Fax: [phone]  Is this the correct pharmacy for this prescription? Yes If no, delete pharmacy and type the correct one.   Has the prescription been filled recently? no  Is the patient out of the medication? Yes  Has the patient been seen for an appointment in the last year OR does the patient have an upcoming appointment? Yes  Can we respond through MyChart? Yes  Agent: Please be advised that Rx refills may take up to 3 business days. We ask that you follow-up with your pharmacy.

## 2024-11-22 ENCOUNTER — Telehealth: Payer: Self-pay | Admitting: Family Medicine

## 2024-11-22 NOTE — Telephone Encounter (Signed)
 Patient called to see if we had any sooner New ob appts. I let the pt know that we have nothing available before she is already scheduled on 2/13. The pt says she is in need of more prenatal vitamins because she is low on money and cannot afford to pay out f pocket again. She wants to know if we can send some to her pharmacy before her new ob appt in February.

## 2024-12-08 ENCOUNTER — Encounter: Admitting: Obstetrics & Gynecology

## 2025-01-03 ENCOUNTER — Ambulatory Visit: Payer: Self-pay | Admitting: Family Medicine
